# Patient Record
Sex: Female | Born: 1970 | Race: White | Hispanic: No | Marital: Married | Smoking: Former smoker
Health system: Southern US, Community
[De-identification: ages and names within clinical notes are randomized; demographics above are authoritative.]

## PROBLEM LIST (undated history)

## (undated) DIAGNOSIS — F319 Bipolar disorder, unspecified: Secondary | ICD-10-CM

## (undated) DIAGNOSIS — R102 Pelvic and perineal pain: Secondary | ICD-10-CM

## (undated) DIAGNOSIS — F419 Anxiety disorder, unspecified: Secondary | ICD-10-CM

## (undated) DIAGNOSIS — F17200 Nicotine dependence, unspecified, uncomplicated: Secondary | ICD-10-CM

## (undated) HISTORY — DX: Pelvic and perineal pain: R10.2

## (undated) HISTORY — DX: Nicotine dependence, unspecified, uncomplicated: F17.200

## (undated) HISTORY — DX: Bipolar disorder, unspecified: F31.9

## (undated) HISTORY — PX: PELVIC LAPAROSCOPY: SHX162

## (undated) HISTORY — DX: Anxiety disorder, unspecified: F41.9

---

## 1996-01-25 HISTORY — PX: TUBAL LIGATION: SHX77

## 2000-08-30 ENCOUNTER — Emergency Department (HOSPITAL_COMMUNITY): Admission: EM | Admit: 2000-08-30 | Discharge: 2000-08-30 | Payer: Self-pay | Admitting: Emergency Medicine

## 2001-01-24 HISTORY — PX: FOOT ARTHRODESIS, MODIFIED MCBRIDE: SUR52

## 2001-10-05 ENCOUNTER — Other Ambulatory Visit: Admission: RE | Admit: 2001-10-05 | Discharge: 2001-10-05 | Payer: Self-pay | Admitting: Obstetrics and Gynecology

## 2003-01-25 HISTORY — PX: SPINAL FUSION: SHX223

## 2004-11-05 ENCOUNTER — Ambulatory Visit (HOSPITAL_COMMUNITY): Admission: RE | Admit: 2004-11-05 | Discharge: 2004-11-05 | Payer: Self-pay | Admitting: Obstetrics and Gynecology

## 2005-04-21 ENCOUNTER — Inpatient Hospital Stay (HOSPITAL_COMMUNITY): Admission: AD | Admit: 2005-04-21 | Discharge: 2005-04-23 | Payer: Self-pay | Admitting: Specialist

## 2005-04-21 ENCOUNTER — Encounter (INDEPENDENT_AMBULATORY_CARE_PROVIDER_SITE_OTHER): Payer: Self-pay | Admitting: *Deleted

## 2008-01-25 HISTORY — PX: OTHER SURGICAL HISTORY: SHX169

## 2008-09-19 ENCOUNTER — Encounter: Payer: Self-pay | Admitting: Obstetrics and Gynecology

## 2008-09-19 ENCOUNTER — Ambulatory Visit: Payer: Self-pay | Admitting: Obstetrics & Gynecology

## 2008-09-19 ENCOUNTER — Inpatient Hospital Stay (HOSPITAL_COMMUNITY): Admission: RE | Admit: 2008-09-19 | Discharge: 2008-09-20 | Payer: Self-pay | Admitting: Obstetrics and Gynecology

## 2008-12-29 ENCOUNTER — Ambulatory Visit: Payer: Self-pay | Admitting: Women's Health

## 2009-04-30 ENCOUNTER — Ambulatory Visit: Payer: Self-pay | Admitting: Radiology

## 2009-04-30 ENCOUNTER — Emergency Department (HOSPITAL_BASED_OUTPATIENT_CLINIC_OR_DEPARTMENT_OTHER): Admission: EM | Admit: 2009-04-30 | Discharge: 2009-04-30 | Payer: Self-pay | Admitting: Emergency Medicine

## 2009-05-25 ENCOUNTER — Encounter: Admission: RE | Admit: 2009-05-25 | Discharge: 2009-05-25 | Payer: Self-pay | Admitting: Diagnostic Neuroimaging

## 2009-05-25 ENCOUNTER — Ambulatory Visit: Payer: Self-pay | Admitting: Women's Health

## 2009-08-26 ENCOUNTER — Ambulatory Visit: Payer: Self-pay | Admitting: Women's Health

## 2009-11-05 ENCOUNTER — Ambulatory Visit: Payer: Self-pay | Admitting: Women's Health

## 2010-05-01 LAB — CBC
HCT: 32.2 % — ABNORMAL LOW (ref 36.0–46.0)
HCT: 41.2 % (ref 36.0–46.0)
Hemoglobin: 10.9 g/dL — ABNORMAL LOW (ref 12.0–15.0)
Hemoglobin: 13.8 g/dL (ref 12.0–15.0)
MCHC: 33.4 g/dL (ref 30.0–36.0)
MCHC: 33.8 g/dL (ref 30.0–36.0)
MCV: 89.2 fL (ref 78.0–100.0)
MCV: 90.1 fL (ref 78.0–100.0)
Platelets: 258 10*3/uL (ref 150–400)
Platelets: 277 10*3/uL (ref 150–400)
RBC: 3.57 MIL/uL — ABNORMAL LOW (ref 3.87–5.11)
RBC: 4.61 MIL/uL (ref 3.87–5.11)
RDW: 13.6 % (ref 11.5–15.5)
RDW: 14 % (ref 11.5–15.5)
WBC: 20.2 10*3/uL — ABNORMAL HIGH (ref 4.0–10.5)
WBC: 9.2 10*3/uL (ref 4.0–10.5)

## 2010-05-01 LAB — PREGNANCY, URINE: Preg Test, Ur: NEGATIVE

## 2010-06-08 NOTE — Discharge Summary (Signed)
NAMEMIANGEL, FLOM NO.:  [id number]   MEDICAL RECORD NO.:  [id number]          PATIENT TYPE:  INP   LOCATION:  9305                          FACILITY:  [facility]   PHYSICIAN:  Arlyce Harman, MD     DATE OF BIRTH:  1970-09-01   DATE OF ADMISSION:  09/19/2008  DATE OF DISCHARGE:                               DISCHARGE SUMMARY   REASON FOR HOSPITALIZATION:  This patient is a 40 year old gravida 2,  para 2, who is status post C section x2 and tubal ligation who complains  of worsening right lower quadrant pain, an ultrasound revealed a  persistent 4.5 cm cyst on ovary and due to the severity of the pain, the  patient has requested surgical treatment.  On September 19, 2008, she  underwent exploratory laparotomy with extensive lysis of adhesions of  the right tube and ovary, the omentum, and the anterior abdominal wall  above.  The ovaries and the tube and operative laparoscopy.  Postop  course has been uncomplicated, and the patient has been discharged this  morning in great condition.   DISCHARGE MEDICATIONS:  Percocet 5 mg, she is to take 1-2 every 4-6  hours as needed for pain, she has 30 tablets, Chromagen 1 a day.  She is  to take Colace 1-2 a day for the next 7 days.   LABORATORY FINDINGS:  Hemoglobin is 10.4.   SIGNIFICANT FINDINGS:  None.   PROCEDURES PERFORMED AND TREATMENT RENDERED:  As noted above previously  in dictation.   CONDITION OF THE PATIENT ON DISCHARGE:  The patient is in excellent  condition.  Vital signs are stable and the patient is ambulatory.   DISCHARGE INSTRUCTIONS:  Instructions given to the patient are to come  to my office in 2 days to have staples removed.  The patient is to avoid  intercourse for 3 weeks.  She is to call if her temperature goes 101.8  or if she has any unusual pain or heavy bleeding.      Arlyce Harman, MD  Electronically Signed     EG/MEDQ  D:  09/20/2008  T:  09/20/2008  Job:  [id number]

## 2010-06-08 NOTE — Op Note (Signed)
Tammy Huber, Tammy Huber NO.:  [id number]   MEDICAL RECORD NO.:  [id number]         PATIENT TYPE:  WINP   LOCATION:                                FACILITY:  [facility]   PHYSICIAN:  Arlyce Harman, MD     DATE OF BIRTH:  [birth year]   DATE OF PROCEDURE:  DATE OF DISCHARGE:                               OPERATIVE REPORT   PREOPERATIVE DIAGNOSES:  1. Right lower quadrant pain.  2. Right ovarian cyst.   POSTOPERATIVE DIAGNOSIS:  1. Right lower quadrant pain.  2. Right ovarian cyst.  3. Extensive pelvic adhesions of the ovaries, tubes, bowel, anterior      abdominal wall, omentum, and uterus.   NAME OF OPERATION/PROCEDURE:  She underwent exploratory laparotomy with  extensive lysis of adhesions, right salpingo-oophorectomy, and operative  laparoscopy.   SURGEON:  Arlyce Harman, MD   ASSISTANT:  Dr. Les Pou from the faculty practice.   DESCRIPTION OF THE FINDINGS:  The patient is a 40 year old gravida 2,  para 2 who is status post cesarean section and tubal ligation who has  had severe right lower quadrant pain.  Ultrasound confirmed a cyst on  the ovary, which was not resolved with conservative method.  Therefore,  surgery was planned.  Risks and possible complications of the surgery  were explained and informed consent was given for the planned  procedures.   PROCEDURE:  The patient was placed on the table in the supine position.  General anesthesia was induced.  She was then placed in a dorsal  lithotomy position and the abdomen and vagina were sterilely prepped and  draped in the usual fashion.  A Foley catheter was placed in the  bladder.  A single-tooth tenaculum was placed on the cervix and the  uterus was sounded.  A Hulka tenaculum was then placed on the cervix and  we went above.  An infraumbilical incision was made.  The 10-11 trocar  was inserted without difficulty and the laparoscope was inserted.  We  did confirm that we were in the peritoneal cavity.   Therefore, CO2 was  insufflated into the abdomen.  The second puncture was placed in the  midline using a 10-11 to accommodate the Endopouch and upon  visualization of the peritoneal cavity, there were adhesions of the  omentum to the anterior abdominal wall.  The right tube and ovary could  not be visualized.  We could visualize the left tube and ovary.  It was  covered with adhesions.  The uterus was pulled up against the anterior  abdominal wall and the cul-de-sac was clean.  The procedure was  initiated by coagulating the omentum from the anterior abdominal wall  and lysis of adhesions was performed via laparoscopy; however, it was  soon determined that this was a futile procedure because the adhesions  were just too dense and there were too many adhesions.  Therefore, the  laparoscopic instruments were removed and the gas released.  The  infraumbilical incision was closed with a single 4-0 Vicryl suture.  The  patient was placed in more of a supine position and a  Pfannenstiel  incision was made in the lower abdomen and the abdomen was entered with  some difficulty.  These were adhesions of the omentum to the anterior  abdominal wall and adhesions of the uterus to the anterior abdominal  wall.  After much careful dissection and obtaining hemostasis, we were  able to visualize the pelvis.  The bowel had to be dissected away from  the posterior portion of the uterus on the left using Metzenbaum  scissors and sharp dissection.  After release of the bowel adhesions as  well as omental adhesions, we were able to pack the bowel away and  mobilize the right adnexa.  We were able to use blunt and sharp  dissection to mobilize the right adnexa and a curved Heaney was used to  clamp the infundibulopelvic ligament and the adnexal pedicle and this  was double clamped from each side and then the ovarian tube was  transected off and handed off to the nurse.  The pedicles were doubly  tied with 0  Vicryl suture.  Hemostasis was excellent.  We did identify  the ureter, it was well below the pedicles and it did peristalse  normally on that side.  At this point, we identified the left ovary and  tube and it did appear normal with the exception of some filmy  adhesions.  Attention was then turned to the uterus, which was  completely adhered up against the anterior abdominal pain and the  decision was made since the patient only complained of right lower  quadrant pain, that we would not extend her surgery by trying to dissect  the uterus off, which was probably lead to necessitating a hysterectomy,  which we had not consented to.  Therefore, the procedure was terminated  after hemostasis was obtained using 2-0 Vicryl interrupted sutures on  top of the uterus to maintain hemostasis.  At this point, the abdomen  was irrigated and suctioned out.  Hemostasis was good and a stitch was  placed over the top of the uterine fundus and the peritoneum was closed  after the packing had been removed and we had a good sponge count.  The  peritoneum was closed with 2-0 chromic suture in a running fashion.  The  fascia was closed with 0 Vicryl in a running locked fashion.  The subcu  was closed with 2-0 plain in a running fashion.  The staples were placed  on the skin.  At the end of the procedure, sponge and instrument counts  were correct.  Estimated blood loss 300 mL.      Arlyce Harman, MD  Electronically Signed     EG/MEDQ  D:  09/19/2008  T:  09/20/2008  Job:  [id number]

## 2010-06-11 NOTE — Op Note (Signed)
NAMEMIKAELAH, TROSTLE              ACCOUNT NO.:  [id number]   MEDICAL RECORD NO.:  [id number]          PATIENT TYPE:  INP   LOCATION:  3017                         FACILITY:  [facility]   PHYSICIAN:  Kerrin Champagne, M.D.   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   PROCEDURE:  Bilateral microdiskectomy L5-S1 with excision of extruded HNP  left L5-S1, bilateral microdisk excision L5-S1.   SURGEON:  Dr. Vira Browns.   ASSISTANT:  Wende Neighbors, P.A.C.   ANESTHESIA:  GOT.  Dr. Arta Bruce / Dr. Gypsy Balsam.   SPECIMENS:  Large extruded HNP central and left-sided L5-S1.  Calcified HNP  of L5-S1.  Right-sided HNP.   ESTIMATED BLOOD LOSS:  50 cc.   COMPLICATIONS:  None.  The patient returned to the PACU in good condition.   BRIEF CLINICAL HISTORY:  This patient is a 40 year old female with a five-  week history of severe back pain with radiation in both legs, left side  greater than right.  She has undergone conservative management with the use  of epidural steroids and oral steroid medication without relief of pain.  She has significant discomfort and continues to use narcotic medication to  relieve her discomfort.  She has positive straight-leg-raise sign on the  left.  Positive right-sided opposite straight-leg-raise sign.  Weakness in  left foot dorsiflexion and left great toe dorsiflexion.  Diminished left  ankle jerk.  Radiographs with degenerative disk change at L5-S1.  MRI study  with a very large extruded HNP over the posterior aspect of the vertebral  body of L5 associated with HNP at the L5-S1 level that is centrally  oriented.  Extruded fragment extends along the left side of the thecal  sac,  affecting the left L5 and S1 nerve roots.   INTRAOPERATIVE FINDINGS:  As above.   DESCRIPTION OF PROCEDURE:  After adequate general anesthesia with the  patient in the knee/chest position, an Mooar frame was used.  Standard  preoperative antibiotics.  Standard prep with DuraPrep solution.  All  pressure points were well padded.  She was draped in the usual manner.  Two  spinal needles placed and intraoperative lateral radiograph demonstrated the  needles on either side of the spinous process of L5.  The patient had a  standard incision, about 2-1/2 inches in length through the skin and  subcutaneous layers.  She was about three inches deep with subcu fat down to  the lumbodorsal fascia.  This was incised on both sides to the respective  spinous process of L5 and of S1.  A Kocher clamp placed on the spinous  process of expected L5. Intraoperative lateral radiograph demonstrated the  Kocher on the L5 spinous process.  This was marked for continued  identification with a single 2-0 Vicryl stitch.  Cobb was then used to  elevate the paralumbar muscles bilaterally off of the posterior aspects of  the lamina of L5 and the superior aspect of the S1 lamina.  McCullough  retractors were then inserted and retraction obtained exposing both sides of  the posterior aspect of the inner laminar region at L5-S1.  Leksell rongeur  was used to carefully debride any soft tissue attachment to the inferior  aspect of the lamina of L5 and then used to excise a small portion of the  right side inferior aspect of the lamina of L5 and a larger amount on the  left side up to the region of the insertion of the ligamentum flavum on the  left side.   The initial exposure obtained using loupe magnification and headlight.  A 3-  mm Kerrison then used to carefully free up the attachment of the ligamentum  flavum at the L5-S1 level off the undersurface, anterior surface of the  inferior aspect of the lamina of  L5.  This was then lifted with a nerve hook  and resected from the L5 lamina to the S1 lamina, resecting it off of the  superior aspect of the lamina of S1, performing a foraminotomy over the S1  nerve root and removing its attachment to the medial facet at the L5-S1  level on the left side.  A small amount of Gelfoam was then placed along  with a cottonoid.  The same type of laminotomy was made on the right side  using a 3-mm Kerrison and debride the superficial portion of the ligamentum  flavum on the right side.  Then introducing the 3-mm Kerrison over the  superior aspect of the S1 lamina, resecting the ligamentum flavum off of the  superior aspect of the lamina of S1, performing a foraminotomy over the  right S1 nerve root, decompressing the right L5 lateral recess and then  excising the ligamentum flavum on the right side off of the anterior aspect  of the L5 lamina.  This was completed.  Then the operating room microscope  was carefully draped and sterilely brought into the field. On the left side  the pedicle was probed.  The foramina to the S1 nerve root was carefully  probed and ensured patency.  Adequate decompression of the S1 nerve.  S1  nerve root and the lateral aspect of the thecal sac at the L5-S1 level was  then carefully retracted medially.  Epidural veins were cauterized using  bipolar electrocautery.  The posterior aspect of the disk identified with a  calcified shelf over its superior margin.  Continued exploration of the  lateral recess on the left side was continued up to the foramen of the L5  nerve root.  A 3-mm Kerrison used to resect further small portion, more of  the inferior aspect of the lamina of L5, preserving the pars over an 8-mm  area.  Disk material was then encountered superior to the disk space on the  left side in the axillary area of the L5 nerve root.  D'Errico retractor used to retract the thecal sac.  A 15 blade scalpel was then used to incise   the posterior longitudinal ligament on the left side just medial to the  axillary portion of the L5 nerve root.  A nerve hook could then be  introduced to carefully free up disc material and then carefully deliver it  into the laminotomy region.  Pituitary rongeur was then able to grasp the  disk material and in combination with the use  of a nerve hook, several large  pieces of disk material were able to be delivered through the left-sided  laminotomy within the spinal canal, decompressing the extruded fragment.  At  least four large fragments of disk material were resected and this combined  equaled a very large pile of disk material, about 2.5 cm x 2.0 cm x 1.5 cm.   This appeared to be adequate for the extruded fragment seen which was quite  large.  Further attempts at irrigation and resection of more disk material  were uneventful in removing more fragments.  Irrigation was performed into  this area and the use of an up-biting pituitary and attempts at using micro  pituitary were unsuccessful in removing any further disk material here.  The  posterior aspect of the disc was noted to be herniated chronically on the  left side with a calcified shell of bone.  Attempts here at incising the  disk were fairly unsuccessful on the left side.  Attention turned to the  right side where the lateral thecal sac of the L5-S1 was retracted medially,  identified the S1 nerve root, retracting it medially.  Bipolar  electrocautery used to control epidural veins and cauterize them.  Exposing  the posterior disk on the right side, it was noted to be herniated.  This  was protruding.  A 15 blade scalpel was used to make a longitudinal incision  in the disk.  Pituitary was then used to excise herniated disk material from  the right side, decompressing the right side of the spinal canal in the  right posterior disk region.  An Gwyneth Sprout was used to further free up annular  material and then excising it using  pituitary rongeurs, up-biting, down-  biting and straight until no further fragments could be noted.  Attention  then turned to the left side.  Review of the MRI demonstrated that the  posterior protrusion on this side appeared to make up a fair amount of mass  effect continuing on the thecal sac.  Pressure on the posterior aspect of  the disk demonstrated that the calcified portion of the disk appeared to  still be mobile and this potentially could represent a shallow bone that had  fractured with the disk protrusion.  Pituitary rongeur was used to enter the  disk space.  A 3-mm Kerrison used to debride the calcified portion of the  disk posteriorly, resecting this and decompressing the left side of the  thecal sac and the spinal canal at the level of the disk.  Further disk was  resected from the disk space on the left side until no further fragments could be obtained.  Irrigation was performed.  Careful inspection of the  spinal canal demonstrated the L5 nerve root to be exiting freely without any  nerve compression, S1 as well on the left side.  The posterior thecal sac  and ventral aspect of thecal sac with no further compression.  Gelfoam,  thrombin soaked, and cottonoid was placed in the lateral recess on the left  side.  Evaluation of the right side thecal sac and L5-S1 disk again  demonstrates some small fragments that likely were pushed across the midline  with the resection of disk on the left side.  These were excised using  pituitary rongeur.  Irrigation was performed.  A portion of Gelfoam placed.  When hemostasis was obtained, the Gelfoam and cottonoids were removed in  total.  There was no active bleeding present.  Hockey-stick nerve probe  was  easily passed out bilateral S1 and L5 neural foramina, demonstrating their  patency and no further nerve compression present.  The soft tissues were  allowed to fall back into place.  There was no active bleeding present.  The   lumbodorsal fascia was then approximated in the midline with interrupted #1  Vicryl sutures.  Deep subcutaneous layer was approximated with interrupted 0  Vicryl suture, more superficial layers with interrupted 0 and 2-0 Vicryl  sutures.  Skin closed with a running subcu stitch of 4-0 Vicryl.  Skin  closed with Dermabond.  4 x 4's affixed to the skin with Hypafix tape.  The  patient was then returned to a supine position, reactivated, extubated and  returned to the recovery room in satisfactory condition.  All instrument and  sponge counts were correct at the end of the case.      Kerrin Champagne, M.D.  Electronically Signed     JEN/MEDQ  D:  04/21/2005  T:  04/23/2005  Job:  [id number]

## 2010-07-14 ENCOUNTER — Ambulatory Visit (INDEPENDENT_AMBULATORY_CARE_PROVIDER_SITE_OTHER): Payer: BC Managed Care – PPO | Admitting: Women's Health

## 2010-07-14 DIAGNOSIS — B373 Candidiasis of vulva and vagina: Secondary | ICD-10-CM

## 2010-07-14 DIAGNOSIS — N760 Acute vaginitis: Secondary | ICD-10-CM

## 2010-07-14 DIAGNOSIS — N926 Irregular menstruation, unspecified: Secondary | ICD-10-CM

## 2010-10-15 ENCOUNTER — Encounter (HOSPITAL_BASED_OUTPATIENT_CLINIC_OR_DEPARTMENT_OTHER): Payer: Self-pay | Admitting: *Deleted

## 2010-10-15 ENCOUNTER — Emergency Department (HOSPITAL_BASED_OUTPATIENT_CLINIC_OR_DEPARTMENT_OTHER)
Admission: EM | Admit: 2010-10-15 | Discharge: 2010-10-15 | Discharge status: Against Medical Advice | Payer: BC Managed Care – PPO | Attending: Emergency Medicine | Admitting: Emergency Medicine

## 2010-10-15 DIAGNOSIS — R109 Unspecified abdominal pain: Secondary | ICD-10-CM | POA: Insufficient documentation

## 2010-10-15 LAB — COMPREHENSIVE METABOLIC PANEL
ALT: 10 U/L (ref 0–35)
AST: 15 U/L (ref 0–37)
Albumin: 4.3 g/dL (ref 3.5–5.2)
Alkaline Phosphatase: 73 U/L (ref 39–117)
BUN: 11 mg/dL (ref 6–23)
CO2: 25 mEq/L (ref 19–32)
Calcium: 9.6 mg/dL (ref 8.4–10.5)
Chloride: 101 mEq/L (ref 96–112)
Creatinine, Ser: 0.6 mg/dL (ref 0.50–1.10)
GFR calc Af Amer: >60 mL/min (ref >60)
GFR calc non Af Amer: >60 mL/min (ref >60)
Glucose, Bld: 96 mg/dL (ref 70–99)
Potassium: 3.7 mEq/L (ref 3.5–5.1)
Sodium: 137 mEq/L (ref 135–145)
Total Bilirubin: 0.3 mg/dL (ref 0.3–1.2)
Total Protein: 7.6 g/dL (ref 6.0–8.3)

## 2010-10-15 LAB — DIFFERENTIAL
Basophils Absolute: 0 10*3/uL (ref 0.0–0.1)
Basophils Relative: 0 % (ref 0–1)
Eosinophils Absolute: 0.1 10*3/uL (ref 0.0–0.7)
Eosinophils Relative: 1 % (ref 0–5)
Lymphocytes Relative: 14 % (ref 12–46)
Lymphs Abs: 2.6 10*3/uL (ref 0.7–4.0)
Monocytes Absolute: 0.7 10*3/uL (ref 0.1–1.0)
Monocytes Relative: 4 % (ref 3–12)
Neutro Abs: 15.4 10*3/uL — ABNORMAL HIGH (ref 1.7–7.7)
Neutrophils Relative %: 81 % — ABNORMAL HIGH (ref 43–77)

## 2010-10-15 LAB — URINALYSIS, ROUTINE W REFLEX MICROSCOPIC
Bilirubin Urine: NEGATIVE
Glucose, UA: NEGATIVE mg/dL
Ketones, ur: 15 mg/dL — AB
Leukocytes, UA: NEGATIVE
Nitrite: NEGATIVE
Protein, ur: NEGATIVE mg/dL
Specific Gravity, Urine: 1.03 (ref 1.005–1.030)
Urobilinogen, UA: 1 mg/dL (ref 0.0–1.0)
pH: 5.5 (ref 5.0–8.0)

## 2010-10-15 LAB — PREGNANCY, URINE: Preg Test, Ur: NEGATIVE

## 2010-10-15 LAB — URINE MICROSCOPIC-ADD ON

## 2010-10-15 LAB — CBC
HCT: 42.7 % (ref 36.0–46.0)
Hemoglobin: 14.4 g/dL (ref 12.0–15.0)
MCH: 28.6 pg (ref 26.0–34.0)
MCHC: 33.7 g/dL (ref 30.0–36.0)
MCV: 84.7 fL (ref 78.0–100.0)
Platelets: 274 10*3/uL (ref 150–400)
RBC: 5.04 MIL/uL (ref 3.87–5.11)
RDW: 14.6 % (ref 11.5–15.5)
WBC: 19 10*3/uL — ABNORMAL HIGH (ref 4.0–10.5)

## 2010-10-15 NOTE — ED Notes (Signed)
Pt c/o painful urination x 2 days 

## 2010-10-18 ENCOUNTER — Encounter: Payer: Self-pay | Admitting: Women's Health

## 2010-10-18 ENCOUNTER — Ambulatory Visit (INDEPENDENT_AMBULATORY_CARE_PROVIDER_SITE_OTHER): Payer: BC Managed Care – PPO | Admitting: Women's Health

## 2010-10-18 DIAGNOSIS — R102 Pelvic and perineal pain: Secondary | ICD-10-CM

## 2010-10-18 DIAGNOSIS — N949 Unspecified condition associated with female genital organs and menstrual cycle: Secondary | ICD-10-CM

## 2010-10-18 NOTE — Progress Notes (Signed)
Subjective:     Patient ID: Tammy Huber, female   DOB: 1970/09/16, 40 y.o.   MRN: [id number]  HPI had severe low,sharp abdominal pain rated a 10/pain scale on September 21. Evaluated at [facility] in [facility].  Negative CT scan with negative appendix, negative for gallstones, unremarkable liver and spleen. CMP normal, H/H 13.4/40.1 WBC 20.  Pelvic ultrasound normal uterus, hx of rso, left ovary 4.5x3.2 cm with 2.5 simple cyst and 3.7 complex likely representing hemorrhagic cyst. Review of CT scan report, lab reports, ultrasound report reviewed with patient.   Rates pain at 7-8 today. Requests a hysterectomy due to long, 20 year pelvic pain and cyst history.  Currently on Percocet for pain. History of a BTL, and history of adhesions with RSO in 2010. Denies urinary symptoms, discharge or fever. States pain is debilitating and interferes with work and life.    Review of Systems     Objective:   Physical Exam     Assessment:     Pelvic pain and ovarian cysts.    Plan:     Will schedule consult with Dr. Audie Box to discuss possible hysterectomy. ACOG handout was given, reviewed. Did review risks of surgery and will decide a plan  at office visit with Dr. Audie Box.

## 2010-10-19 ENCOUNTER — Ambulatory Visit (INDEPENDENT_AMBULATORY_CARE_PROVIDER_SITE_OTHER): Payer: BC Managed Care – PPO | Admitting: Gynecology

## 2010-10-19 ENCOUNTER — Encounter: Payer: Self-pay | Admitting: Gynecology

## 2010-10-19 VITALS — BP 120/80

## 2010-10-19 DIAGNOSIS — N760 Acute vaginitis: Secondary | ICD-10-CM

## 2010-10-19 DIAGNOSIS — N83209 Unspecified ovarian cyst, unspecified side: Secondary | ICD-10-CM

## 2010-10-19 DIAGNOSIS — A499 Bacterial infection, unspecified: Secondary | ICD-10-CM

## 2010-10-19 DIAGNOSIS — B373 Candidiasis of vulva and vagina: Secondary | ICD-10-CM

## 2010-10-19 DIAGNOSIS — R102 Pelvic and perineal pain: Secondary | ICD-10-CM

## 2010-10-19 DIAGNOSIS — N898 Other specified noninflammatory disorders of vagina: Secondary | ICD-10-CM

## 2010-10-19 DIAGNOSIS — N949 Unspecified condition associated with female genital organs and menstrual cycle: Secondary | ICD-10-CM

## 2010-10-19 DIAGNOSIS — Z113 Encounter for screening for infections with a predominantly sexual mode of transmission: Secondary | ICD-10-CM

## 2010-10-19 MED ORDER — METRONIDAZOLE 500 MG PO TABS: 500.0000 mg | ORAL_TABLET | Freq: Two times a day (BID) | ORAL | Status: AC

## 2010-10-19 MED ORDER — FLUCONAZOLE 150 MG PO TABS: 150.0000 mg | ORAL_TABLET | Freq: Once | ORAL | Status: AC

## 2010-10-19 NOTE — Patient Instructions (Signed)
followup with decision as far surgery

## 2010-10-19 NOTE — Progress Notes (Signed)
Patient presents for consultation concerning hysterectomy. She is a long complex history to include 20 years of pelvic pain primarily deep dyspareunia. She notes pain with intercourse every episode and now is limiting her activities. She has a past history of cesarean section x2 with BTL. She underwent initial laparoscopy by Dr. Arlyce Harman in 2000 for pelvic pain right ovarian cyst and due to extensive pelvic adhesions and adherence of the uterus and the anterior abdominal wall she had to convert to an exploratory laparotomy and RSO. She described again the uterus adherent to the anterior abdominal wall she was able to visualize the left adnexa although there were some filmy adhesions noted. She did not free up the uterus for fear that would lead to a hysterectomy which was not the plan at that time. The patient notes that she is continuing to have pain on and off particularly consistent deep dyspareunia. She most recently was evaluated this past weekend in [place] with acute exacerbation of pain on the left she had a CT scan which showed a generous left ovary and a followup ultrasound which showed a 3-4 cm probable hemorrhagic cyst. She also notes that her periods have been irregular over the last several years she saw [place] in the spring had TSH prolactin which were normal and her most recent ultrasound this past week showed an endometrial echo of 8 mm considered to be normal. Harriett Sine did give her a NuvaRing to try hormonal regulation of her periods which were coming every 2 weeks to every 4 weeks she said she transiently tried this but then stopped it. Her biggest issue is the deep dyspareunia that is interfering with her relationship.  Exam Spine: Straight no CVA tenderness Abdomen: Mild lower, pain no rebound guarding masses organomegaly Pelvic: External BUS vagina with white discharge KOH wet prep done, cervix normal GC Chlamydia screen done, uterus anteverted normal size midline and mobile mild  tenderness to manipulation, right left adnexa without gross masses or significant tenderness, rectovaginal exam is normal  Assessment and plan: Chronic pelvic pain primarily dyspareunia history of RSO with significant pelvic adhesions particularly uterus to an anterior abdominal wall. Most recently with acute exacerbation of pain with evaluation was consistent with a hemorrhagic cyst. 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 As far as her most recent exacerbation of pain, I think it is due to a probable functional hemorrhagic cyst. I discussed possible conservative options to include hormonal manipulation to regulate her menses. She does smoke I don't think estrogen-containing such as pills patch or ring is wise that alternatives such as menstrual suppression Depo-Lupron trial of progesterone only possibilities up to and including IUD were reviewed. As far as the most recent exacerbation of her pain due to probable functional cyst, we could treat this with pain medicine with reultrasound in 2 months to make sure it resolves.  Patient does not describe a lot of pain in between but only the deep dyspareunia. As far as the deep dyspareunia is concerned I really think is probably more structural and at that it will not be correct unless we proceed with hysterectomy and possible LSO. I discussed the whole issue of her adhesive disease how this would significantly increase risk of surgery to include possible attempted laparoscopic probable exploratory laparotomy to complete an increased risk of injury to bowel and bladder ureters vessels and nerves due to the adhesions and required dissection.  The general risks of surgery to include infection incisional complications transfusion were also reviewed along with the risk of damage to internal organs such as bowel bladder ureters vessels and nerves. Options of keeping  her left ovary if normal appearance recognizing the difficulty of reoperation if pain persists versus removing her ovary and initiating ERT were reviewed. The WHI study and the risks of ERT were discussed to include stroke heart attack DVT as well as possible increased risk of breast cancer. I asked the patient and her husband think of all these options and issues and and a followup with me with their decision.  She does have white discharge KOH wet prep was positive for yeast and clue cells unrecoverable with Diflucan 150x1 dose Flagyl 500 twice a day x7 days alcohol avoidance discussed. She'll followup on her GC and Chlamydia screen.

## 2010-10-28 ENCOUNTER — Encounter: Payer: Self-pay | Admitting: Gynecology

## 2010-11-26 ENCOUNTER — Encounter: Payer: Self-pay | Admitting: Women's Health

## 2010-11-26 ENCOUNTER — Ambulatory Visit (INDEPENDENT_AMBULATORY_CARE_PROVIDER_SITE_OTHER): Payer: BC Managed Care – PPO | Admitting: Women's Health

## 2010-11-26 DIAGNOSIS — B373 Candidiasis of vulva and vagina: Secondary | ICD-10-CM

## 2010-11-26 DIAGNOSIS — L293 Anogenital pruritus, unspecified: Secondary | ICD-10-CM

## 2010-11-26 DIAGNOSIS — N898 Other specified noninflammatory disorders of vagina: Secondary | ICD-10-CM

## 2010-11-26 MED ORDER — TERCONAZOLE 0.8 % VA CREA: 1.0000 | TOPICAL_CREAM | Freq: Every day | VAGINAL | Status: AC

## 2010-11-26 NOTE — Progress Notes (Signed)
  Presents with a complaint of vaginal discharge with some itching, not sure if yeast or  BV. Denies any UTI symptoms or fever. Monthly cycle/BTL.  Exam: External genitalia is slightly erythematous at introitus, speculum exam scant white discharge, wet prep positive for yeast.   Plan: Terazol 3 one applicator at bedtime x3, yeast prevention discussed call or return if no relief.

## 2010-12-20 ENCOUNTER — Ambulatory Visit (INDEPENDENT_AMBULATORY_CARE_PROVIDER_SITE_OTHER): Payer: BC Managed Care – PPO | Admitting: Women's Health

## 2010-12-20 ENCOUNTER — Encounter: Payer: Self-pay | Admitting: Women's Health

## 2010-12-20 VITALS — BP 110/70

## 2010-12-20 DIAGNOSIS — B3731 Acute candidiasis of vulva and vagina: Secondary | ICD-10-CM

## 2010-12-20 DIAGNOSIS — B373 Candidiasis of vulva and vagina: Secondary | ICD-10-CM

## 2010-12-20 DIAGNOSIS — N898 Other specified noninflammatory disorders of vagina: Secondary | ICD-10-CM

## 2010-12-20 DIAGNOSIS — N949 Unspecified condition associated with female genital organs and menstrual cycle: Secondary | ICD-10-CM

## 2010-12-20 MED ORDER — FLUCONAZOLE 100 MG PO TABS: 100.0000 mg | ORAL_TABLET | Freq: Every day | ORAL | Status: AC

## 2010-12-20 NOTE — Progress Notes (Signed)
THE DIFLUCAN RX DID NOT E-SCRIBE SO I CALLED RX TO [facility] PHARMACY

## 2010-12-20 NOTE — Progress Notes (Signed)
Patient ID: Tammy Huber, female   DOB: 06/05/1970, 40 y.o.   MRN: [id number] Presents with a complaint of vaginal itching and burning for  one week. States had her cycle last week with symptoms of mostly vaginal burning, used a pad instead of tampons with no relief. States occasionally will have odor, but denies any at this time. States feels like she gets yeast symptoms with her cycle each month. Contraceptives with BTL. Treated for yeast less than one month ago.  Exam: External genitalia is slightly erythematous at introitus, speculum exam cervix is pink, healthy scant white discharge vaginal walls are erythematous. Bimanual no CMT or adnexal fullness or tenderness. Wet prep positive for yeast  Plan: Diflucan 100 by mouth daily for 5 days, then weekly for several weeks, and then monthly with cycle to help prevent recurrence. Is aware of yeast prevention, will call if no relief.

## 2010-12-20 NOTE — Patient Instructions (Signed)
Take diflucan daily for 5 days and then weekly for 3-4 weeks and then monthly with cycle.

## 2011-01-13 DIAGNOSIS — Z0000 Encounter for general adult medical examination without abnormal findings: Secondary | ICD-10-CM | POA: Insufficient documentation

## 2011-04-05 ENCOUNTER — Telehealth: Payer: Self-pay | Admitting: *Deleted

## 2011-04-05 NOTE — Telephone Encounter (Signed)
(  pt is aware you are out of office)Pt called c/o yeast infection symptoms  x 1 week. I offered OV to pt and she declined stating she has meetings at work that she must attend. Pt is requesting diflucan. Pt said that you gave her diflucan to take before and after period(see 12/20/10 office note) please advise

## 2011-04-06 MED ORDER — FLUCONAZOLE 100 MG PO TABS: ORAL_TABLET | ORAL | Status: DC

## 2011-04-06 NOTE — Telephone Encounter (Signed)
Patient informed rx sent.

## 2011-04-06 NOTE — Telephone Encounter (Signed)
Please call in Diflucan 100 by mouth daily for 3 days then monthly when necessary #15 no refills. Office visit if no relief. Tammy Huber patient has had problems with recurrent yeast.

## 2011-05-20 ENCOUNTER — Ambulatory Visit (INDEPENDENT_AMBULATORY_CARE_PROVIDER_SITE_OTHER): Payer: BC Managed Care – PPO | Admitting: Women's Health

## 2011-05-20 ENCOUNTER — Encounter: Payer: Self-pay | Admitting: Women's Health

## 2011-05-20 VITALS — Wt 203.0 lb

## 2011-05-20 DIAGNOSIS — B9689 Other specified bacterial agents as the cause of diseases classified elsewhere: Secondary | ICD-10-CM

## 2011-05-20 DIAGNOSIS — R102 Pelvic and perineal pain: Secondary | ICD-10-CM

## 2011-05-20 DIAGNOSIS — N760 Acute vaginitis: Secondary | ICD-10-CM

## 2011-05-20 DIAGNOSIS — A499 Bacterial infection, unspecified: Secondary | ICD-10-CM

## 2011-05-20 DIAGNOSIS — N949 Unspecified condition associated with female genital organs and menstrual cycle: Secondary | ICD-10-CM

## 2011-05-20 LAB — URINALYSIS W MICROSCOPIC + REFLEX CULTURE
Bilirubin Urine: NEGATIVE
Casts: NONE SEEN
Crystals: NONE SEEN
Glucose, UA: NEGATIVE mg/dL
Ketones, ur: NEGATIVE mg/dL
Leukocytes, UA: NEGATIVE
Nitrite: NEGATIVE
Protein, ur: NEGATIVE mg/dL
Specific Gravity, Urine: 1.025 (ref 1.005–1.030)
Urobilinogen, UA: 0.2 mg/dL (ref 0.0–1.0)
WBC, UA: NONE SEEN WBC/hpf (ref <3)
pH: 5.5 (ref 5.0–8.0)

## 2011-05-20 LAB — WET PREP FOR TRICH, YEAST, CLUE: Yeast Wet Prep HPF POC: NONE SEEN

## 2011-05-20 MED ORDER — METRONIDAZOLE 500 MG PO TABS: 500.0000 mg | ORAL_TABLET | Freq: Two times a day (BID) | ORAL | Status: AC

## 2011-05-20 NOTE — Progress Notes (Signed)
Patient ID: Tammy Huber, female   DOB: Nov 06, 1970, 41 y.o.   MRN: [id number] Presents with the complaint of vaginal discharge that is causing a burning sensation. Was treated for bronchitis with a Z-Pak last week, bronchitis is better. Denies any urinary symptoms other than frequency which she always has. Denies a fever. Has had problems with recurrent BV and yeast.  Exam: External genitalia within normal limits, speculum exam cervix is pink without lesion, scant white discharge, minimal erythema. Wet prep positive for amines and moderate amount of clue cells. Bimanual no CMT or adnexa a fullness or tenderness. Discomfort mostly in the vaginal area.  BV  Plan: Flagyl 500 by mouth twice a day for 7 days, alcohol precautions reviewed. After symptoms resolve will try boric acid gelcaps  twice weekly. Overdue for an annual exam, will schedule.

## 2011-05-25 ENCOUNTER — Telehealth: Payer: Self-pay | Admitting: *Deleted

## 2011-05-25 MED ORDER — METRONIDAZOLE 0.75 % VA GEL: 1.0000 | Freq: Every day | VAGINAL | Status: AC

## 2011-05-25 NOTE — Telephone Encounter (Signed)
Ok, please escribe MetroGel vaginal cream 1 applicator at bedtime x5 with no refill.

## 2011-05-25 NOTE — Telephone Encounter (Signed)
Pt informed with the below note, rx sent. 

## 2011-05-25 NOTE — Telephone Encounter (Signed)
Pt was given flagyl 500 mg  rx on 05/20/11, c/o upset stomach while on this medication, pt is requesting gel that was given in the pass. Please advise

## 2011-06-22 ENCOUNTER — Telehealth: Payer: Self-pay | Admitting: *Deleted

## 2011-06-22 MED ORDER — FLUCONAZOLE 150 MG PO TABS: 150.0000 mg | ORAL_TABLET | Freq: Once | ORAL | Status: AC

## 2011-06-22 NOTE — Telephone Encounter (Signed)
Pt informed with the below note. 

## 2011-06-22 NOTE — Telephone Encounter (Signed)
Diflucan 150 mg x1. Office visit if symptoms persist.

## 2011-06-22 NOTE — Telephone Encounter (Signed)
(  pt aware nancy out of office) Pt calling c/o yeast infection due antibody pt had foot surgery, itching,white discharge, vaginal burning. Pt is requesting diflucan to help with yeast. Please advise

## 2011-06-23 ENCOUNTER — Telehealth: Payer: Self-pay | Admitting: *Deleted

## 2011-06-23 MED ORDER — TERCONAZOLE 0.8 % VA CREA: 1.0000 | TOPICAL_CREAM | Freq: Every day | VAGINAL | Status: AC

## 2011-06-23 NOTE — Telephone Encounter (Signed)
Terazol 3 day cream will need office visit if her symptoms persist.

## 2011-06-23 NOTE — Telephone Encounter (Signed)
Pt informed with the below note. 

## 2011-06-23 NOTE — Telephone Encounter (Signed)
Follow up from yesterday telephone encounter pt said diflucan x1 dose has done nonething regarding yeast. Still itching very bad, she would like to have some cream. She is can't make OV due to foot surgery. Please advise

## 2011-09-14 ENCOUNTER — Ambulatory Visit (INDEPENDENT_AMBULATORY_CARE_PROVIDER_SITE_OTHER): Payer: BC Managed Care – PPO | Admitting: Women's Health

## 2011-09-14 ENCOUNTER — Encounter: Payer: Self-pay | Admitting: Women's Health

## 2011-09-14 DIAGNOSIS — F419 Anxiety disorder, unspecified: Secondary | ICD-10-CM | POA: Insufficient documentation

## 2011-09-14 DIAGNOSIS — F329 Major depressive disorder, single episode, unspecified: Secondary | ICD-10-CM

## 2011-09-14 DIAGNOSIS — K219 Gastro-esophageal reflux disease without esophagitis: Secondary | ICD-10-CM

## 2011-09-14 DIAGNOSIS — F32A Depression, unspecified: Secondary | ICD-10-CM

## 2011-09-14 DIAGNOSIS — F17200 Nicotine dependence, unspecified, uncomplicated: Secondary | ICD-10-CM

## 2011-09-14 DIAGNOSIS — IMO0001 Reserved for inherently not codable concepts without codable children: Secondary | ICD-10-CM

## 2011-09-14 DIAGNOSIS — N898 Other specified noninflammatory disorders of vagina: Secondary | ICD-10-CM

## 2011-09-14 DIAGNOSIS — N949 Unspecified condition associated with female genital organs and menstrual cycle: Secondary | ICD-10-CM

## 2011-09-14 DIAGNOSIS — G8929 Other chronic pain: Secondary | ICD-10-CM

## 2011-09-14 DIAGNOSIS — F341 Dysthymic disorder: Secondary | ICD-10-CM

## 2011-09-14 LAB — WET PREP FOR TRICH, YEAST, CLUE
Clue Cells Wet Prep HPF POC: NONE SEEN
Trich, Wet Prep: NONE SEEN
WBC, Wet Prep HPF POC: NONE SEEN
Yeast Wet Prep HPF POC: NONE SEEN

## 2011-09-14 MED ORDER — NYSTATIN-TRIAMCINOLONE 100000-0.1 UNIT/GM-% EX OINT: TOPICAL_OINTMENT | Freq: Two times a day (BID) | CUTANEOUS | Status: DC

## 2011-09-14 MED ORDER — METRONIDAZOLE 0.75 % VA GEL: VAGINAL | Status: AC

## 2011-09-14 MED ORDER — FLUCONAZOLE 100 MG PO TABS: ORAL_TABLET | ORAL | Status: DC

## 2011-09-14 NOTE — Progress Notes (Signed)
Patient ID: [name], female   DOB: 08-01-1970, 41 y.o.   MRN: [id number] Presents with complaint of vaginal burning with rectal burning with bowel movements with small amount of blood on toilet tissue. Also intermittent left lower quadrant pain with occasional loose stools, no constipation. Denies fever or urinary symptoms. History of chronic pelvic pain of unknown origin. History of recurrent BV using  MetroGel vaginal cream 1 applicator after menses with fair relief. States has had increased stress due to to work related employee issues.  Exam: No CVAT, abdomen soft, obese nontender no rebound or radiation of pain. External genitalia erythematous at introitus , rectum erythematous no visible hemorrhoids, fissures or blood. Speculum exam cervix is pink, minimal erythema, wet prep negative. Bimanual no CMT or adnexal tenderness or pain. Rectal exam negative no palpable hemorrhoids, no blood noted.   Vaginal and rectal burning Questionable rectal bleeding  Plan: Home Hemoccult card given with instructions. Mycolog cream to external genitalia and rectum twice daily for several days. Instructed to call if burning sensation continues. Continue with MetroGel vaginal cream 1 applicator after menstrual cycle.

## 2011-11-30 ENCOUNTER — Encounter: Payer: Self-pay | Admitting: Women's Health

## 2011-11-30 ENCOUNTER — Ambulatory Visit (INDEPENDENT_AMBULATORY_CARE_PROVIDER_SITE_OTHER): Payer: BC Managed Care – PPO | Admitting: Women's Health

## 2011-11-30 DIAGNOSIS — N898 Other specified noninflammatory disorders of vagina: Secondary | ICD-10-CM

## 2011-11-30 LAB — WET PREP FOR TRICH, YEAST, CLUE
Clue Cells Wet Prep HPF POC: NONE SEEN
Trich, Wet Prep: NONE SEEN
Yeast Wet Prep HPF POC: NONE SEEN

## 2011-11-30 NOTE — Progress Notes (Signed)
Patient ID: Tammy Huber, female   DOB: [birth year], 41 y.o.   MRN: [id number] Presents with complaint of slight vaginal discharge with odor and rectal pain/burning with bowel movements. Denies urinary symptoms, fever or itching. History of chronic pelvic pain/adhesions, TAH and recurrent BV and yeast.  Exam: No CVAT, external genitalia within normal limits, speculum exam vaginal walls pink minimal discharge wet prep negative. Several small superficial fissures near rectal area.  Rectal irritation  Plan: Stool softener, increase fiber rich foods in diet,. A&D Ointment to rectal area twice daily. Instructed to call if area does not heal.

## 2011-12-14 ENCOUNTER — Encounter: Payer: BC Managed Care – PPO | Admitting: Women's Health

## 2012-02-03 ENCOUNTER — Other Ambulatory Visit: Payer: Self-pay

## 2012-02-03 ENCOUNTER — Other Ambulatory Visit: Payer: Self-pay | Admitting: Women's Health

## 2012-02-03 DIAGNOSIS — N898 Other specified noninflammatory disorders of vagina: Secondary | ICD-10-CM

## 2012-02-03 MED ORDER — FLUCONAZOLE 100 MG PO TABS: ORAL_TABLET | ORAL | Status: DC

## 2012-02-15 ENCOUNTER — Encounter: Payer: BC Managed Care – PPO | Admitting: Women's Health

## 2012-02-22 ENCOUNTER — Encounter: Payer: Self-pay | Admitting: Women's Health

## 2012-02-22 ENCOUNTER — Other Ambulatory Visit (HOSPITAL_COMMUNITY)
Admission: RE | Admit: 2012-02-22 | Discharge: 2012-02-22 | Disposition: A | Discharge status: Home / Self Care | Payer: BC Managed Care – PPO | Source: Ambulatory Visit | Attending: Women's Health | Admitting: Women's Health

## 2012-02-22 ENCOUNTER — Ambulatory Visit (INDEPENDENT_AMBULATORY_CARE_PROVIDER_SITE_OTHER): Payer: BC Managed Care – PPO | Admitting: Women's Health

## 2012-02-22 ENCOUNTER — Encounter: Payer: BC Managed Care – PPO | Admitting: Women's Health

## 2012-02-22 VITALS — BP 124/68 | Ht 63.0 in | Wt 206.0 lb

## 2012-02-22 DIAGNOSIS — N760 Acute vaginitis: Secondary | ICD-10-CM

## 2012-02-22 DIAGNOSIS — Z01419 Encounter for gynecological examination (general) (routine) without abnormal findings: Secondary | ICD-10-CM | POA: Insufficient documentation

## 2012-02-22 DIAGNOSIS — Z1151 Encounter for screening for human papillomavirus (HPV): Secondary | ICD-10-CM | POA: Insufficient documentation

## 2012-02-22 DIAGNOSIS — N898 Other specified noninflammatory disorders of vagina: Secondary | ICD-10-CM

## 2012-02-22 DIAGNOSIS — Z23 Encounter for immunization: Secondary | ICD-10-CM

## 2012-02-22 DIAGNOSIS — B9689 Other specified bacterial agents as the cause of diseases classified elsewhere: Secondary | ICD-10-CM

## 2012-02-22 DIAGNOSIS — A499 Bacterial infection, unspecified: Secondary | ICD-10-CM

## 2012-02-22 DIAGNOSIS — L293 Anogenital pruritus, unspecified: Secondary | ICD-10-CM

## 2012-02-22 LAB — URINALYSIS W MICROSCOPIC + REFLEX CULTURE
Bilirubin Urine: NEGATIVE
Casts: NONE SEEN
Glucose, UA: NEGATIVE mg/dL
Leukocytes, UA: NEGATIVE
WBC, UA: NONE SEEN WBC/hpf (ref <3)
pH: 6.5 (ref 5.0–8.0)

## 2012-02-22 LAB — WET PREP FOR TRICH, YEAST, CLUE: Yeast Wet Prep HPF POC: NONE SEEN

## 2012-02-22 MED ORDER — FLUCONAZOLE 100 MG PO TABS: ORAL_TABLET | ORAL | Status: DC

## 2012-02-22 MED ORDER — METRONIDAZOLE 0.75 % VA GEL: VAGINAL | Status: DC

## 2012-02-22 NOTE — Progress Notes (Signed)
Tammy Huber 1970/10/29 [id number]    History:    The patient presents for annual exam.  Monthly 2-4 days cycle/BTL. History of chronic pelvic pain, had a laparoscopic surgery that noted numerous adhesions in 2010. Long-term history of anxiety and depression does see a counselor and psychiatrist for medication. Is a smoker and is aware of hazards of smoking. Has had problems with recurrent BV and yeast. History of normal Paps. Has not had a mammogram. Labs at primary care, reports as normal.   Past medical history, past surgical history, family history and social history were all reviewed and documented in the EPIC chart. 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   ROS:  A  ROS was performed and pertinent positives and negatives are included in the history.  Exam:  Filed Vitals:   02/22/12 1450  BP: 124/68    General appearance:  Normal Head/Neck:  Normal, without cervical or supraclavicular adenopathy. Thyroid:  Symmetrical, normal in size, without palpable masses or nodularity. Respiratory  Effort:  Normal  Auscultation:  Clear without wheezing or rhonchi Cardiovascular  Auscultation:  Regular rate, without rubs, murmurs or gallops  Edema/varicosities:  Not grossly evident Abdominal  Soft,nontender, without masses, guarding or rebound.  Liver/spleen:  No organomegaly noted  Hernia:  None appreciated  Skin  Inspection:  Grossly normal  Palpation:  Grossly normal Neurologic/psychiatric  Orientation:  Normal with appropriate conversation.  Mood/affect:  Normal  Genitourinary    Breasts: Examined lying and sitting.     Right: Without masses, retractions, discharge or axillary adenopathy.     Left: Without masses, retractions, discharge or axillary adenopathy.   Inguinal/mons:  Normal without inguinal adenopathy  External genitalia:  Normal  BUS/Urethra/Skene's glands:  Normal  Bladder:  Normal  Vagina:  Normal  Cervix:  Normal  Uterus:   normal in size, shape and  contour.  Midline and mobile  Adnexa/parametria:     Rt: Without masses or tenderness.   Lt: Without masses or tenderness.  Anus and perineum: Normal  Digital rectal exam: Normal sphincter tone without palpated masses or tenderness  Assessment/Plan:  42 y.o. M. WF G2 P2 for annual exam with complaint of vaginal burning with slight itching.  Chronic pelvic pain/adhesions Bacteria vaginosis Anxiety/depression-stable meds per psychiatrist Smoker Obesity  Plan: MetroGel vaginal cream 1 applicator at bedtime x5, prescription, proper use, alcohol precautions reviewed. SBE's, instructed to schedule mammogram and to have annually. Increase exercise and decrease calories for weight loss. UA, Pap. Normal Pap 2010, new screening guidelines reviewed.Tdap vaccine given, has a grandchild. Vaginal lubricants as needed with intercourse. Diflucan 100 week prior to menstrual cycle, has had good relief of symptoms, prescription given. Aware of hazards of smoking will try to decrease/quit.   Harrington Challenger [facility], 3:41 PM 02/22/2012

## 2012-02-22 NOTE — Patient Instructions (Addendum)

## 2012-03-10 ENCOUNTER — Other Ambulatory Visit: Payer: Self-pay

## 2012-06-05 ENCOUNTER — Other Ambulatory Visit: Payer: Self-pay | Admitting: *Deleted

## 2012-06-05 DIAGNOSIS — N898 Other specified noninflammatory disorders of vagina: Secondary | ICD-10-CM

## 2012-06-05 MED ORDER — NYSTATIN-TRIAMCINOLONE 100000-0.1 UNIT/GM-% EX OINT: TOPICAL_OINTMENT | Freq: Two times a day (BID) | CUTANEOUS | Status: DC

## 2012-10-25 ENCOUNTER — Ambulatory Visit (INDEPENDENT_AMBULATORY_CARE_PROVIDER_SITE_OTHER): Payer: BC Managed Care – PPO | Admitting: Women's Health

## 2012-10-25 DIAGNOSIS — N760 Acute vaginitis: Secondary | ICD-10-CM

## 2012-10-25 DIAGNOSIS — N899 Noninflammatory disorder of vagina, unspecified: Secondary | ICD-10-CM

## 2012-10-25 DIAGNOSIS — N898 Other specified noninflammatory disorders of vagina: Secondary | ICD-10-CM

## 2012-10-25 DIAGNOSIS — A499 Bacterial infection, unspecified: Secondary | ICD-10-CM

## 2012-10-25 DIAGNOSIS — B9689 Other specified bacterial agents as the cause of diseases classified elsewhere: Secondary | ICD-10-CM

## 2012-10-25 DIAGNOSIS — R300 Dysuria: Secondary | ICD-10-CM

## 2012-10-25 LAB — URINALYSIS W MICROSCOPIC + REFLEX CULTURE
Bilirubin Urine: NEGATIVE
Glucose, UA: NEGATIVE mg/dL
Protein, ur: NEGATIVE mg/dL
WBC, UA: NONE SEEN WBC/hpf (ref <3)

## 2012-10-25 LAB — WET PREP FOR TRICH, YEAST, CLUE
Trich, Wet Prep: NONE SEEN
Yeast Wet Prep HPF POC: NONE SEEN

## 2012-10-25 MED ORDER — METRONIDAZOLE 0.75 % VA GEL: VAGINAL | Status: DC

## 2012-10-25 NOTE — Progress Notes (Signed)
Patient ID: Tammy Huber, female   DOB: [birth year], 42 y.o.   MRN: [id number] Presents with complaint of vaginal discharge with irritation, burning, mild itching and odor. Slight burning with urination, denies pain at end of stream. Denies abdominal pain, fever. BTL. Has been under increased stress, husband had ruptured intestine from diverticulitis, doing better, hoping to have colostomy reversed.  Exam: Appears well. External genitalia within normal limits, speculum exam  moderate amount of a white adherent discharge with odor noted. Wet prep positive for amines, clues, TNTC bacteria. UA: Rare bacteria, no leukocytes or WBCs..  Bacteria vaginosis  Plan: MetroGel vaginal cream 1 applicator at bedtime x5. Alcohol precautions reviewed. Instructed to call if no relief of symptoms.

## 2012-11-29 ENCOUNTER — Other Ambulatory Visit: Payer: Self-pay

## 2013-01-25 ENCOUNTER — Other Ambulatory Visit: Payer: Self-pay

## 2013-01-25 DIAGNOSIS — N898 Other specified noninflammatory disorders of vagina: Secondary | ICD-10-CM

## 2013-01-25 MED ORDER — FLUCONAZOLE 100 MG PO TABS: ORAL_TABLET | ORAL | Status: DC

## 2013-03-18 ENCOUNTER — Other Ambulatory Visit: Payer: Self-pay

## 2013-03-18 DIAGNOSIS — N898 Other specified noninflammatory disorders of vagina: Secondary | ICD-10-CM

## 2013-03-18 NOTE — Telephone Encounter (Signed)
Disregard previous note, office visit and/or AEX. Review with Britta MccreedyBarbara diflucan is hard on the liver, and should not be having that many yeast infections.

## 2013-03-18 NOTE — Telephone Encounter (Signed)
Ok to call in but have her schedule AEX

## 2013-03-18 NOTE — Telephone Encounter (Signed)
Tammy Huber, Patient got #15 on 01/25/13.

## 2013-03-19 NOTE — Telephone Encounter (Signed)
Patient informed. She explained that she had been very sick and on antibiotics for the last 3 weeks but I explained to her that #15 Diflucan in a month is too much.  Hard on the liver and we may need a different plan if her yeast if really that much of a problem. Rec office visit to come and see [place] to discuss options. Refill refused with pharmacy.

## 2013-03-20 ENCOUNTER — Other Ambulatory Visit: Payer: Self-pay | Admitting: Women's Health

## 2013-03-20 MED ORDER — TERCONAZOLE 0.8 % VA CREA: 1.0000 | TOPICAL_CREAM | Freq: Every day | VAGINAL | Status: DC

## 2013-03-20 NOTE — Telephone Encounter (Signed)
Left detailed message on voice mail and Rx sent.

## 2013-03-20 NOTE — Telephone Encounter (Signed)
Please call and call in Terazol 3 one applicator at bedtime x3, office visit if no relief.

## 2013-04-02 ENCOUNTER — Encounter: Payer: BC Managed Care – PPO | Admitting: Women's Health

## 2013-04-23 ENCOUNTER — Ambulatory Visit (INDEPENDENT_AMBULATORY_CARE_PROVIDER_SITE_OTHER): Payer: BC Managed Care – PPO | Admitting: Gynecology

## 2013-04-23 ENCOUNTER — Encounter: Payer: Self-pay | Admitting: Gynecology

## 2013-04-23 DIAGNOSIS — L293 Anogenital pruritus, unspecified: Secondary | ICD-10-CM

## 2013-04-23 DIAGNOSIS — N898 Other specified noninflammatory disorders of vagina: Secondary | ICD-10-CM

## 2013-04-23 LAB — WET PREP FOR TRICH, YEAST, CLUE
CLUE CELLS WET PREP: NONE SEEN
TRICH WET PREP: NONE SEEN

## 2013-04-23 MED ORDER — FLUCONAZOLE 200 MG PO TABS
200.0000 mg | ORAL_TABLET | Freq: Every day | ORAL | Status: DC
Start: 2013-04-23 — End: 2013-05-07

## 2013-04-23 NOTE — Patient Instructions (Signed)
Take Diflucan pill daily for 5 days. Followup if your symptoms continue, worsen or recur.  Monilial Vaginitis Vaginitis in a soreness, swelling and redness (inflammation) of the vagina and vulva. Monilial vaginitis is not a sexually transmitted infection. CAUSES  Yeast vaginitis is caused by yeast (candida) that is normally found in your vagina. With a yeast infection, the candida has overgrown in number to a point that upsets the chemical balance. SYMPTOMS   White, thick vaginal discharge.  Swelling, itching, redness and irritation of the vagina and possibly the lips of the vagina (vulva).  Burning or painful urination.  Painful intercourse. DIAGNOSIS  Things that may contribute to monilial vaginitis are:  Postmenopausal and virginal states.  Pregnancy.  Infections.  Being tired, sick or stressed, especially if you had monilial vaginitis in the past.  Diabetes. Good control will help lower the chance.  Birth control pills.  Tight fitting garments.  Using bubble bath, feminine sprays, douches or deodorant tampons.  Taking certain medications that kill germs (antibiotics).  Sporadic recurrence can occur if you become ill. TREATMENT  Your caregiver will give you medication.  There are several kinds of anti monilial vaginal creams and suppositories specific for monilial vaginitis. For recurrent yeast infections, use a suppository or cream in the vagina 2 times a week, or as directed.  Anti-monilial or steroid cream for the itching or irritation of the vulva may also be used. Get your caregiver's permission.  Painting the vagina with methylene blue solution may help if the monilial cream does not work.  Eating yogurt may help prevent monilial vaginitis. HOME CARE INSTRUCTIONS   Finish all medication as prescribed.  Do not have sex until treatment is completed or after your caregiver tells you it is okay.  Take warm sitz baths.  Do not douche.  Do not use tampons,  especially scented ones.  Wear cotton underwear.  Avoid tight pants and panty hose.  Tell your sexual partner that you have a yeast infection. They should go to their caregiver if they have symptoms such as mild rash or itching.  Your sexual partner should be treated as well if your infection is difficult to eliminate.  Practice safer sex. Use condoms.  Some vaginal medications cause latex condoms to fail. Vaginal medications that harm condoms are:  Cleocin cream.  Butoconazole (Femstat).  Terconazole (Terazol) vaginal suppository.  Miconazole (Monistat) (may be purchased over the counter). SEEK MEDICAL CARE IF:   You have a temperature by mouth above 102 F (38.9 C).  The infection is getting worse after 2 days of treatment.  The infection is not getting better after 3 days of treatment.  You develop blisters in or around your vagina.  You develop vaginal bleeding, and it is not your menstrual period.  You have pain when you urinate.  You develop intestinal problems.  You have pain with sexual intercourse. Document Released: 10/20/2004 Document Revised: 04/04/2011 Document Reviewed: 07/04/2008 [facility] Patient Information 2014 [employer], [place].

## 2013-04-23 NOTE — Progress Notes (Signed)
Tammy SkillBarbara J Pizzolato 01/30/1970 [id number]        43 y.o.  G2P2 presents with several day history of intense vulvar pruritus and irritation. Used Terazol vaginal cream x3 nights that she had but symptom seemed to be getting worse. No odor or urinary symptoms.  Past medical history,surgical history, problem list, medications, allergies, family history and social history were all reviewed and documented in the EPIC chart.  Exam: Kim assistant General appearance  Normal External BUS vagina with generalized erythema and thick cakey discharge. Cervix normal. Uterus normal size midline mobile nontender. Adnexa without masses or tenderness.  Assessment/Plan:  43 y.o. G2P2 with yeast vulva vaginitis. Wet prep is positive for yeast. Unusual not to respond to Calpine Corporationerazol. We'll treat more aggressively with Diflucan 200 mg daily x5 days. Followup if symptoms persist, worsen or recur.   Note: This document was prepared with digital dictation and possible smart phrase technology. Any transcriptional errors that result from this process are unintentional.   Dara LordsFONTAINE,Lavren Lewan P MD, 2:30 PM 04/23/2013

## 2013-05-07 ENCOUNTER — Encounter: Payer: Self-pay | Admitting: Women's Health

## 2013-05-07 ENCOUNTER — Ambulatory Visit (INDEPENDENT_AMBULATORY_CARE_PROVIDER_SITE_OTHER): Payer: BC Managed Care – PPO | Admitting: Women's Health

## 2013-05-07 VITALS — BP 118/76 | Ht 61.0 in | Wt 198.0 lb

## 2013-05-07 DIAGNOSIS — Z833 Family history of diabetes mellitus: Secondary | ICD-10-CM

## 2013-05-07 DIAGNOSIS — Z01419 Encounter for gynecological examination (general) (routine) without abnormal findings: Secondary | ICD-10-CM

## 2013-05-07 DIAGNOSIS — E079 Disorder of thyroid, unspecified: Secondary | ICD-10-CM

## 2013-05-07 DIAGNOSIS — Z13220 Encounter for screening for lipoid disorders: Secondary | ICD-10-CM

## 2013-05-07 LAB — CBC WITH DIFFERENTIAL/PLATELET
Basophils Absolute: 0.1 10*3/uL (ref 0.0–0.1)
Basophils Relative: 1 % (ref 0–1)
Eosinophils Absolute: 0.2 10*3/uL (ref 0.0–0.7)
Eosinophils Relative: 2 % (ref 0–5)
HCT: 41.3 % (ref 36.0–46.0)
Hemoglobin: 13.7 g/dL (ref 12.0–15.0)
LYMPHS PCT: 33 % (ref 12–46)
Lymphs Abs: 2.8 10*3/uL (ref 0.7–4.0)
MCH: 28.2 pg (ref 26.0–34.0)
MCHC: 33.2 g/dL (ref 30.0–36.0)
MCV: 85 fL (ref 78.0–100.0)
Monocytes Absolute: 0.4 10*3/uL (ref 0.1–1.0)
Monocytes Relative: 5 % (ref 3–12)
NEUTROS ABS: 5 10*3/uL (ref 1.7–7.7)
Neutrophils Relative %: 59 % (ref 43–77)
PLATELETS: 268 10*3/uL (ref 150–400)
RBC: 4.86 MIL/uL (ref 3.87–5.11)
RDW: 14.6 % (ref 11.5–15.5)
WBC: 8.4 10*3/uL (ref 4.0–10.5)

## 2013-05-07 LAB — LIPID PANEL
CHOLESTEROL: 153 mg/dL (ref 0–200)
HDL: 44 mg/dL (ref >39)
LDL Cholesterol: 98 mg/dL (ref 0–99)
Total CHOL/HDL Ratio: 3.5 Ratio
Triglycerides: 55 mg/dL (ref <150)
VLDL: 11 mg/dL (ref 0–40)

## 2013-05-07 LAB — GLUCOSE, RANDOM: GLUCOSE: 80 mg/dL (ref 70–99)

## 2013-05-07 LAB — TSH: TSH: 1.537 u[IU]/mL (ref 0.350–4.500)

## 2013-05-07 NOTE — Patient Instructions (Signed)

## 2013-05-07 NOTE — Addendum Note (Signed)
Addended by: Marijo SanesFALLS, Charmeka [place] W on: 05/07/2013 03:07 PM   Modules accepted: Orders

## 2013-05-07 NOTE — Progress Notes (Addendum)
Tammy SkillBarbara J Huber 04/01/1970 [id number]    History:    Presents for annual exam. Monthly cycle, BTL. History of chronic pelvic pain, now only with intercourse, had a laparoscopic surgery that noted numerous adhesions in 2010. Long-term history of anxiety and depression, sees a Veterinary surgeoncounselor and psychiatrist for medication. Is a smoker and is aware of hazards of smoking. Has had problems with recurrent BV and yeast. History of normal Paps. Has not had a mammogram, counseled on scheduling. Poison ivy on both arms, will follow up with primary care.  Past medical history, past surgical history, family history and social history were all reviewed and documented in the EPIC chart. 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 One granddaughter.   ROS:  A  ROS was performed and pertinent positives and negatives are included.  Exam:  Filed Vitals:   05/07/13 1205  BP: 118/76    General appearance:  Normal, appears well Thyroid:  Symmetrical, normal in size, without palpable masses or nodularity. Respiratory  Auscultation:  Clear without wheezing or rhonchi Cardiovascular  Auscultation:  Regular rate, without rubs, murmurs or gallops  Edema/varicosities:  Not grossly evident Abdominal  Soft,nontender, without masses, guarding or rebound.  Liver/spleen:  No organomegaly noted  Hernia:  None appreciated  Skin  Inspection:  Grossly normal, erythematous lesions b/l distal arms   Breasts: Examined lying and sitting.     Right: Without masses, retractions, discharge or axillary adenopathy.     Left: Without masses, retractions, discharge or axillary adenopathy. Gentitourinary   Inguinal/mons:  Normal without inguinal adenopathy  External genitalia:  Normal  BUS/Urethra/Skene's glands:  Normal  Vagina:  Normal  Cervix:  Normal  Uterus:  normal in size, shape and contour.  Midline and mobile  Adnexa/parametria:     Rt: Without masses or tenderness.   Lt: Without masses or tenderness.  Anus and  perineum: Normal  Digital rectal exam: Normal sphincter tone without palpated masses or tenderness  Assessment/Plan:  43 y.o. MWF G2P2 for annual exam with no complaints.     Chronic pelvic pain now only dyspareunia Anxiety/depression-stable meds per psychiatrist  Smoker Obesity Monthly cycles/BTL  Plan: SBE's, instructed to schedule mammogram and to have annually. Increase exercise and decrease calories for weight loss. Normal Pap 2014, new screening guidelines reviewed. UA, lipid panel, TSH, glucose, CBC.   Harrington Challengerancy [facility], TheWHNP, 1:01 PM 05/07/2013

## 2013-05-08 ENCOUNTER — Encounter: Payer: Self-pay | Admitting: Women's Health

## 2013-05-08 LAB — URINALYSIS W MICROSCOPIC + REFLEX CULTURE
Bacteria, UA: NONE SEEN
Bilirubin Urine: NEGATIVE
CASTS: NONE SEEN
Crystals: NONE SEEN
GLUCOSE, UA: NEGATIVE mg/dL
Hgb urine dipstick: NEGATIVE
KETONES UR: NEGATIVE mg/dL
LEUKOCYTES UA: NEGATIVE
Nitrite: NEGATIVE
PH: 7 (ref 5.0–8.0)
Protein, ur: NEGATIVE mg/dL
Specific Gravity, Urine: 1.022 (ref 1.005–1.030)
Urobilinogen, UA: 0.2 mg/dL (ref 0.0–1.0)

## 2013-05-15 ENCOUNTER — Ambulatory Visit: Payer: BC Managed Care – PPO | Admitting: Women's Health

## 2013-05-22 ENCOUNTER — Ambulatory Visit: Payer: BC Managed Care – PPO | Admitting: Women's Health

## 2013-07-06 ENCOUNTER — Other Ambulatory Visit: Payer: Self-pay | Admitting: Gynecology

## 2013-07-08 NOTE — Telephone Encounter (Signed)
Please call ok to refill if no relief office visit

## 2013-08-14 ENCOUNTER — Other Ambulatory Visit: Payer: Self-pay | Admitting: Women's Health

## 2013-08-14 ENCOUNTER — Emergency Department (HOSPITAL_COMMUNITY): Payer: BC Managed Care – PPO

## 2013-08-14 ENCOUNTER — Emergency Department (HOSPITAL_COMMUNITY)
Admission: EM | Admit: 2013-08-14 | Discharge: 2013-08-14 | Disposition: A | Discharge status: Home / Self Care | Payer: BC Managed Care – PPO | Attending: Emergency Medicine | Admitting: Emergency Medicine

## 2013-08-14 ENCOUNTER — Encounter (HOSPITAL_COMMUNITY): Payer: Self-pay | Admitting: Emergency Medicine

## 2013-08-14 ENCOUNTER — Ambulatory Visit (INDEPENDENT_AMBULATORY_CARE_PROVIDER_SITE_OTHER): Payer: BC Managed Care – PPO

## 2013-08-14 ENCOUNTER — Ambulatory Visit (INDEPENDENT_AMBULATORY_CARE_PROVIDER_SITE_OTHER): Payer: BC Managed Care – PPO | Admitting: Women's Health

## 2013-08-14 DIAGNOSIS — Z3202 Encounter for pregnancy test, result negative: Secondary | ICD-10-CM | POA: Insufficient documentation

## 2013-08-14 DIAGNOSIS — R339 Retention of urine, unspecified: Secondary | ICD-10-CM

## 2013-08-14 DIAGNOSIS — R1031 Right lower quadrant pain: Secondary | ICD-10-CM

## 2013-08-14 DIAGNOSIS — G8929 Other chronic pain: Secondary | ICD-10-CM | POA: Insufficient documentation

## 2013-08-14 DIAGNOSIS — Z9889 Other specified postprocedural states: Secondary | ICD-10-CM | POA: Insufficient documentation

## 2013-08-14 DIAGNOSIS — F17200 Nicotine dependence, unspecified, uncomplicated: Secondary | ICD-10-CM | POA: Insufficient documentation

## 2013-08-14 DIAGNOSIS — Z79899 Other long term (current) drug therapy: Secondary | ICD-10-CM | POA: Insufficient documentation

## 2013-08-14 DIAGNOSIS — N898 Other specified noninflammatory disorders of vagina: Secondary | ICD-10-CM | POA: Insufficient documentation

## 2013-08-14 DIAGNOSIS — Z8659 Personal history of other mental and behavioral disorders: Secondary | ICD-10-CM | POA: Insufficient documentation

## 2013-08-14 LAB — CBC WITH DIFFERENTIAL/PLATELET
BASOS ABS: 0 10*3/uL (ref 0.0–0.1)
BASOS PCT: 0 % (ref 0–1)
Eosinophils Absolute: 0.2 10*3/uL (ref 0.0–0.7)
Eosinophils Relative: 2 % (ref 0–5)
HEMATOCRIT: 44.3 % (ref 36.0–46.0)
HEMOGLOBIN: 14.8 g/dL (ref 12.0–15.0)
LYMPHS PCT: 41 % (ref 12–46)
Lymphs Abs: 3.9 10*3/uL (ref 0.7–4.0)
MCH: 29.2 pg (ref 26.0–34.0)
MCHC: 33.4 g/dL (ref 30.0–36.0)
MCV: 87.5 fL (ref 78.0–100.0)
MONO ABS: 0.4 10*3/uL (ref 0.1–1.0)
Monocytes Relative: 5 % (ref 3–12)
NEUTROS ABS: 4.8 10*3/uL (ref 1.7–7.7)
Neutrophils Relative %: 52 % (ref 43–77)
Platelets: 236 10*3/uL (ref 150–400)
RBC: 5.06 MIL/uL (ref 3.87–5.11)
RDW: 14 % (ref 11.5–15.5)
WBC: 9.3 10*3/uL (ref 4.0–10.5)

## 2013-08-14 LAB — URINALYSIS, ROUTINE W REFLEX MICROSCOPIC
Bilirubin Urine: NEGATIVE
Glucose, UA: NEGATIVE mg/dL
Ketones, ur: NEGATIVE mg/dL
LEUKOCYTES UA: NEGATIVE
NITRITE: NEGATIVE
PROTEIN: NEGATIVE mg/dL
Specific Gravity, Urine: 1.014 (ref 1.005–1.030)
UROBILINOGEN UA: 0.2 mg/dL (ref 0.0–1.0)
pH: 6 (ref 5.0–8.0)

## 2013-08-14 LAB — WET PREP FOR TRICH, YEAST, CLUE
CLUE CELLS WET PREP: NONE SEEN
TRICH WET PREP: NONE SEEN
WBC, Wet Prep HPF POC: NONE SEEN
YEAST WET PREP: NONE SEEN

## 2013-08-14 LAB — COMPREHENSIVE METABOLIC PANEL
ALBUMIN: 4.2 g/dL (ref 3.5–5.2)
ALK PHOS: 78 U/L (ref 39–117)
ALT: 13 U/L (ref 0–35)
AST: 18 U/L (ref 0–37)
Anion gap: 14 (ref 5–15)
BUN: 9 mg/dL (ref 6–23)
CO2: 23 meq/L (ref 19–32)
Calcium: 9.4 mg/dL (ref 8.4–10.5)
Chloride: 101 mEq/L (ref 96–112)
Creatinine, Ser: 0.62 mg/dL (ref 0.50–1.10)
GFR calc Af Amer: >90 mL/min (ref >90)
Glucose, Bld: 85 mg/dL (ref 70–99)
POTASSIUM: 4.2 meq/L (ref 3.7–5.3)
Sodium: 138 mEq/L (ref 137–147)
Total Protein: 7.7 g/dL (ref 6.0–8.3)

## 2013-08-14 LAB — URINE MICROSCOPIC-ADD ON

## 2013-08-14 LAB — LIPASE, BLOOD: Lipase: 15 U/L (ref 11–59)

## 2013-08-14 LAB — POC URINE PREG, ED: PREG TEST UR: NEGATIVE

## 2013-08-14 MED ORDER — ONDANSETRON 8 MG PO TBDP
8.0000 mg | ORAL_TABLET | Freq: Once | ORAL | Status: AC
  Administered 2013-08-14: 8 mg via ORAL
  Filled 2013-08-14: qty 1

## 2013-08-14 MED ORDER — METHYLPREDNISOLONE SODIUM SUCC 125 MG IJ SOLR: 125.0000 mg | Freq: Once | INTRAMUSCULAR | Status: DC

## 2013-08-14 MED ORDER — FAMOTIDINE IN NACL 20-0.9 MG/50ML-% IV SOLN: 20.0000 mg | Freq: Once | INTRAVENOUS | Status: DC

## 2013-08-14 MED ORDER — MORPHINE SULFATE 4 MG/ML IJ SOLN
4.0000 mg | Freq: Once | INTRAMUSCULAR | Status: AC
  Administered 2013-08-14: 4 mg via INTRAVENOUS
  Filled 2013-08-14: qty 1

## 2013-08-14 MED ORDER — ONDANSETRON HCL 4 MG/2ML IJ SOLN
4.0000 mg | Freq: Once | INTRAMUSCULAR | Status: AC
  Administered 2013-08-14: 4 mg via INTRAVENOUS
  Filled 2013-08-14: qty 2

## 2013-08-14 MED ORDER — OXYCODONE-ACETAMINOPHEN 5-325 MG PO TABS: 1.0000 | ORAL_TABLET | Freq: Four times a day (QID) | ORAL | Status: DC | PRN

## 2013-08-14 MED ORDER — IOHEXOL 300 MG/ML  SOLN
50.0000 mL | Freq: Once | INTRAMUSCULAR | Status: AC | PRN
  Administered 2013-08-14: 50 mL via ORAL

## 2013-08-14 MED ORDER — IOHEXOL 300 MG/ML  SOLN
100.0000 mL | Freq: Once | INTRAMUSCULAR | Status: AC | PRN
  Administered 2013-08-14: 100 mL via INTRAVENOUS

## 2013-08-14 MED ORDER — SODIUM CHLORIDE 0.9 % IV BOLUS (SEPSIS): 1000.0000 mL | Freq: Once | INTRAVENOUS | Status: DC

## 2013-08-14 MED ORDER — OXYCODONE-ACETAMINOPHEN 5-325 MG PO TABS
2.0000 | ORAL_TABLET | Freq: Once | ORAL | Status: AC
Start: 2013-08-14 — End: 2013-08-14
  Administered 2013-08-14: 2 via ORAL
  Filled 2013-08-14: qty 2

## 2013-08-14 MED ORDER — DIPHENHYDRAMINE HCL 50 MG/ML IJ SOLN: 50.0000 mg | Freq: Once | INTRAMUSCULAR | Status: DC

## 2013-08-14 MED ORDER — ONDANSETRON HCL 4 MG PO TABS: 4.0000 mg | ORAL_TABLET | Freq: Four times a day (QID) | ORAL | Status: DC

## 2013-08-14 NOTE — ED Notes (Signed)
Pt c/o right lower abd pain that started yesterday morning, states she also started bleeding this morning.

## 2013-08-14 NOTE — ED Notes (Signed)
Patient transported to CT 

## 2013-08-14 NOTE — ED Provider Notes (Signed)
CSN: [id number]     Arrival date & time 08/14/13  1706 History   First MD Initiated Contact with Patient 08/14/13 1715     Chief Complaint  Patient presents with  . Abdominal Pain    right     (Consider location/radiation/quality/duration/timing/severity/associated sxs/prior Treatment) HPI Comments: Patient is a 43 year old female with history of chronic pelvic pain, bipolar 1 disorder, anxiety, depression who presents to the emergency department today with 2 days of right lower quadrant pain. She states this began yesterday and is a burning pain. It was worse when she woke up this morning. She has associated vaginal spotting. She denies fever, chills, nausea, vomiting, diarrhea. She still has her appendix. She was seen by her OB/GYN earlier today. A pelvic exam and transvaginal ultrasound was performed. Her OB/GYN sent her to the emergency department to rule out appendicitis. Her OB/GYN physician does not believe that this is a GYN complaints.  Patient is a 43 y.o. female presenting with abdominal pain. The history is provided by the patient. No language interpreter was used.  Abdominal Pain Associated symptoms: vaginal bleeding   Associated symptoms: no chest pain, no chills, no diarrhea, no fever, no nausea, no shortness of breath and no vomiting     Past Medical History  Diagnosis Date  . Pain, female pelvic     since 1990  . Smoker   . Anxiety   . Depression   . Bipolar 1 disorder    Past Surgical History  Procedure Laterality Date  . Tubal ligation  1998  . Cesarean section  1990 and 1998  . Spinal fusion  2005    lower back  . Foot arthrodesis, modified mcbride  2003    right foot  . Xlap rso  2010    extensive adhesions  . Pelvic laparoscopy      RSO   Family History  Problem Relation Age of Onset  . Breast cancer Maternal Grandmother     after age 31   History  Substance Use Topics  . Smoking status: Current Every Day Smoker -- 0.25 packs/day  . Smokeless  tobacco: Never Used  . Alcohol Use: Yes     Comment: occassionally   OB History   Grav Para Term Preterm Abortions TAB SAB Ect Mult Living   2 2        2      Review of Systems  Constitutional: Negative for fever and chills.  Respiratory: Negative for shortness of breath.   Cardiovascular: Negative for chest pain.  Gastrointestinal: Positive for abdominal pain. Negative for nausea, vomiting and diarrhea.  Genitourinary: Positive for vaginal bleeding.  All other systems reviewed and are negative.     Allergies  Codeine  Home Medications   Prior to Admission medications   Medication Sig Start Date End Date Taking? Authorizing Provider  esomeprazole (NEXIUM) 40 MG capsule Take 40 mg by mouth daily before breakfast.     Yes Historical Provider, MD   BP 121/78  Pulse 83  Temp(Src) 98.7 F (37.1 C) (Oral)  Resp 18  SpO2 99%  LMP 07/22/2013 Physical Exam  Nursing note and vitals reviewed. Constitutional: She is oriented to person, place, and time. She appears well-developed and well-nourished. No distress.  HENT:  Head: Normocephalic and atraumatic.  Right Ear: External ear normal.  Left Ear: External ear normal.  Nose: Nose normal.  Mouth/Throat: Oropharynx is clear and moist.  Eyes: Conjunctivae are normal.  Neck: Normal range of motion.  Cardiovascular: Normal rate,  regular rhythm and normal heart sounds.   Pulmonary/Chest: Effort normal and breath sounds normal. No stridor. No respiratory distress. She has no wheezes. She has no rales.  Abdominal: Soft. She exhibits no distension. There is tenderness in the right lower quadrant. There is no rigidity, no rebound and no guarding.  Musculoskeletal: Normal range of motion.  Neurological: She is alert and oriented to person, place, and time. She has normal strength.  Skin: Skin is warm and dry. She is not diaphoretic. No erythema.  Psychiatric: She has a normal mood and affect. Her behavior is normal.    ED Course   Procedures (including critical care time) Labs Review Labs Reviewed  COMPREHENSIVE METABOLIC PANEL - Abnormal; Notable for the following:    Total Bilirubin <0.2 (*)    All other components within normal limits  URINALYSIS, ROUTINE W REFLEX MICROSCOPIC - Abnormal; Notable for the following:    Hgb urine dipstick SMALL (*)    All other components within normal limits  URINE CULTURE  CBC WITH DIFFERENTIAL  LIPASE, BLOOD  URINE MICROSCOPIC-ADD ON  POC URINE PREG, ED    Imaging Review Ct Abdomen Pelvis W Contrast  08/14/2013   CLINICAL DATA:  Right lower quadrant pain.  EXAM: CT ABDOMEN AND PELVIS WITH CONTRAST  TECHNIQUE: Multidetector CT imaging of the abdomen and pelvis was performed using the standard protocol following bolus administration of intravenous contrast.  CONTRAST:  100mL OMNIPAQUE IOHEXOL 300 MG/ML SOLN, 50mL OMNIPAQUE IOHEXOL 300 MG/ML SOLN  COMPARISON:  07/23/2007  FINDINGS: Lung Bases: Unremarkable.  Liver:  No focal abnormality. No evidence for hepatomegaly.  Spleen: No splenomegaly. No focal mass lesion.  Stomach: Nondistended. No gastric wall thickening. No evidence of outlet obstruction.  Pancreas: No focal mass lesion. No dilatation of the main duct. No intraparenchymal cyst. No peripancreatic edema.  Gallbladder/Biliary: No evidence for gallstones. No pericholecystic fluid. No intrahepatic or extrahepatic biliary dilation.  Kidneys/Adrenals: No adrenal nodule. Kidneys are normal in appearance bilaterally.  Bowel Loops: Duodenum is normally positioned as is the ligament of Treitz. No small bowel obstruction. Terminal ileum is normal. The appendix is normal. No abnormality in the colon by CT. No colonic diverticulosis or diverticulitis.  Nodes: No intraperitoneal or retroperitoneal lymphadenopathy in the abdomen. No evidence for pelvic sidewall lymphadenopathy.  Vasculature: No abdominal aortic aneurysm.  Pelvic Genitourinary: Bladder is decompressed. Head originating in knee  uterine myometrium may be related to fibroids. There is no adnexal mass.  Bones/Musculoskeletal: Bone windows reveal no worrisome lytic or sclerotic osseous lesions. Degenerative changes seen at L5-S1.  Body Wall: No evidence for abdominal wall hernia.  Other: No intraperitoneal free fluid.  IMPRESSION: No acute findings in the abdomen or pelvis. Specifically, no findings to explain the patient's history of right lower quadrant pain.   Electronically Signed   By: Kennith CenterEric  Mansell M.D.   On: 08/14/2013 19:26     EKG Interpretation None      MDM   Final diagnoses:  Right lower quadrant abdominal pain   Patient is nontoxic, nonseptic appearing, in no apparent distress.  Patient's pain and other symptoms adequately managed in emergency department.  Labs, imaging and vitals reviewed.  Patient does not meet the SIRS or Sepsis criteria.  On repeat exam patient does not have a surgical abdomen and there are no peritoneal signs.  No indication of appendicitis, bowel obstruction, bowel perforation, cholecystitis, diverticulitis, PID or ectopic pregnancy. Patient with transvaginal US done earlier today which was grossly normal. Wet prep completely negative  earlier. Patient discharged home with symptomatic treatment and given strict instructions for follow-up with their primary care physician.  I have also discussed reasons to return immediately to the ER.  Patient expresses understanding and agrees with plan.      Mora Bellman, PA-C 08/15/13 [phone]

## 2013-08-14 NOTE — ED Notes (Signed)
PA at bedside.

## 2013-08-14 NOTE — Discharge Instructions (Signed)
Abdominal Pain, Women °Abdominal (stomach, pelvic, or belly) pain can be caused by many things. It is important to tell your doctor: °· The location of the pain. °· Does it come and go or is it present all the time? °· Are there things that start the pain (eating certain foods, exercise)? °· Are there other symptoms associated with the pain (fever, nausea, vomiting, diarrhea)? °All of this is helpful to know when trying to find the cause of the pain. °CAUSES  °· Stomach: virus or bacteria infection, or ulcer. °· Intestine: appendicitis (inflamed appendix), regional ileitis (Crohn's disease), ulcerative colitis (inflamed colon), irritable bowel syndrome, diverticulitis (inflamed diverticulum of the colon), or cancer of the stomach or intestine. °· Gallbladder disease or stones in the gallbladder. °· Kidney disease, kidney stones, or infection. °· Pancreas infection or cancer. °· Fibromyalgia (pain disorder). °· Diseases of the female organs: °¨ Uterus: fibroid (non-cancerous) tumors or infection. °¨ Fallopian tubes: infection or tubal pregnancy. °¨ Ovary: cysts or tumors. °¨ Pelvic adhesions (scar tissue). °¨ Endometriosis (uterus lining tissue growing in the pelvis and on the pelvic organs). °¨ Pelvic congestion syndrome (female organs filling up with blood just before the menstrual period). °¨ Pain with the menstrual period. °¨ Pain with ovulation (producing an egg). °¨ Pain with an IUD (intrauterine device, birth control) in the uterus. °¨ Cancer of the female organs. °· Functional pain (pain not caused by a disease, may improve without treatment). °· Psychological pain. °· Depression. °DIAGNOSIS  °Your doctor will decide the seriousness of your pain by doing an examination. °· Blood tests. °· X-rays. °· Ultrasound. °· CT scan (computed tomography, special type of X-ray). °· MRI (magnetic resonance imaging). °· Cultures, for infection. °· Barium enema (dye inserted in the large intestine, to better view it with  X-rays). °· Colonoscopy (looking in intestine with a lighted tube). °· Laparoscopy (minor surgery, looking in abdomen with a lighted tube). °· Major abdominal exploratory surgery (looking in abdomen with a large incision). °TREATMENT  °The treatment will depend on the cause of the pain.  °· Many cases can be observed and treated at home. °· Over-the-counter medicines recommended by your caregiver. °· Prescription medicine. °· Antibiotics, for infection. °· Birth control pills, for painful periods or for ovulation pain. °· Hormone treatment, for endometriosis. °· Nerve blocking injections. °· Physical therapy. °· Antidepressants. °· Counseling with a psychologist or psychiatrist. °· Minor or major surgery. °HOME CARE INSTRUCTIONS  °· Do not take laxatives, unless directed by your caregiver. °· Take over-the-counter pain medicine only if ordered by your caregiver. Do not take aspirin because it can cause an upset stomach or bleeding. °· Try a clear liquid diet (broth or water) as ordered by your caregiver. Slowly move to a bland diet, as tolerated, if the pain is related to the stomach or intestine. °· Have a thermometer and take your temperature several times a day, and record it. °· Bed rest and sleep, if it helps the pain. °· Avoid sexual intercourse, if it causes pain. °· Avoid stressful situations. °· Keep your follow-up appointments and tests, as your caregiver orders. °· If the pain does not go away with medicine or surgery, you may try: °¨ Acupuncture. °¨ Relaxation exercises (yoga, meditation). °¨ Group therapy. °¨ Counseling. °SEEK MEDICAL CARE IF:  °· You notice certain foods cause stomach pain. °· Your home care treatment is not helping your pain. °· You need stronger pain medicine. °· You want your IUD removed. °· You feel faint or   lightheaded. °· You develop nausea and vomiting. °· You develop a rash. °· You are having side effects or an allergy to your medicine. °SEEK IMMEDIATE MEDICAL CARE IF:  °· Your  pain does not go away or gets worse. °· You have a fever. °· Your pain is felt only in portions of the abdomen. The right side could possibly be appendicitis. The left lower portion of the abdomen could be colitis or diverticulitis. °· You are passing blood in your stools (bright red or black tarry stools, with or without vomiting). °· You have blood in your urine. °· You develop chills, with or without a fever. °· You pass out. °MAKE SURE YOU:  °· Understand these instructions. °· Will watch your condition. °· Will get help right away if you are not doing well or get worse. °Document Released: 11/07/2006 Document Revised: 05/27/2013 Document Reviewed: 11/27/2008 °ExitCare® Patient Information ©2015 [employer]. This information is not intended to replace advice given to you by your health care provider. Make sure you discuss any questions you have with your health care provider. ° °

## 2013-08-14 NOTE — Progress Notes (Signed)
Patient ID: Tammy SkillBarbara J Dula, female   DOB: 05/14/1970, 43 y.o.   MRN: [id number] Presents with intense right lower quadrant pain that started yesterday, no relief with Vicodin. Burning sensation lower right quadrant x1 earlier today.  RSO. Normal cycle  2-1/2 weeks ago, has had bloody discharge today. BTL/same partner. Denies urinary symptoms, N/V, constipation, fever, no changes in elimination. History of a kidney stone, pain completely different.  Exam: Appears uncomfortable. Abdomen soft no rebound, tenderness in right lower quadrant.Guarded. External genitalia within normal limits, speculum exam scant menses type bleeding noted, wet prep negative. Bimanual slight CMT, no fullness left, right adnexa very tender. No CVAT Ultrasound: Transvaginal and transabdominal images. Anteverted enlarged uterus with homogeneous echo. Right adnexa no apparent mass, no right ovary remnant seen. Left ovary normal. Previous hydrosalpinx noted. Negative cul-de-sac. Noted bowel shadowing area of pain. (Reports pain increased after ultrasound probe.)  Right lower quadrant pain rule out appendicitis  Plan: [facility] for possible [facility]. Husband is with patient and will drive. [facility] and alerted of [facility] history.

## 2013-08-14 NOTE — ED Notes (Addendum)
Pt reports RLQ pain since yesterday pain 10/10. Pt hx tubes tied and only has 1 ovary. Last menstrual cycle on 6/29. Pt reports vaginal bleeding, not on her period. Pt reports hx of kidney stones and says the pain is not similar to that.hx of chronic pelvic pain, reports pain is not similar to that pain either. [facility] who [facility] ultrasound and found nothing, suspected appendix and that she might be retaining urine. Pt denies trauma or injury.

## 2013-08-15 LAB — URINE CULTURE
CULTURE: NO GROWTH
Colony Count: NO GROWTH

## 2013-08-15 NOTE — ED Provider Notes (Signed)
Medical screening examination/treatment/procedure(s) were performed by non-physician practitioner and as supervising physician I was immediately available for consultation/collaboration.  Lucion Dilger T Kember Boch, MD 08/15/13 2139 

## 2013-09-11 ENCOUNTER — Telehealth: Payer: Self-pay | Admitting: *Deleted

## 2013-09-11 ENCOUNTER — Ambulatory Visit (INDEPENDENT_AMBULATORY_CARE_PROVIDER_SITE_OTHER): Payer: BC Managed Care – PPO | Admitting: Women's Health

## 2013-09-11 ENCOUNTER — Encounter: Payer: Self-pay | Admitting: Women's Health

## 2013-09-11 DIAGNOSIS — N951 Menopausal and female climacteric states: Secondary | ICD-10-CM

## 2013-09-11 DIAGNOSIS — G8929 Other chronic pain: Secondary | ICD-10-CM

## 2013-09-11 DIAGNOSIS — R1031 Right lower quadrant pain: Principal | ICD-10-CM

## 2013-09-11 LAB — FOLLICLE STIMULATING HORMONE: FSH: 93.2 m[IU]/mL

## 2013-09-11 NOTE — Telephone Encounter (Signed)
REFERRAL PLACED FOR [facility], THEY WILL CONTACT PATIENT TO SCHEDULE.

## 2013-09-11 NOTE — Patient Instructions (Signed)

## 2013-09-11 NOTE — Progress Notes (Signed)
Patient ID: Tammy Huber, female   DOB: 02/01/1970, [age] y.o.   MRN: [id number] Presents with complaint of persistent right lower quadrant pain for 6 weeks. Was seen in the office July 17 for severe right lower quadrant pain, had a negative gyn ultrasound, [facility] had a negative CT scan. Reports pain is more severe in the a.m. rates at age 229-10, decreases to a 7 by noon and 6 by evening. LMP 7/23, BTL. Regular monthly cycles prior, has had some increased hot flushes. Denies any change in routine or diet. Has had some nausea with vomiting, not daily. States feels a lump in the right lower quadrant in the groin area. Denies vaginal discharge, urinary symptoms, fever. History of chronic pelvic pain, adhesions but reports pain as different.   Exam: Appears uncomfortable, abdomen obese, no rebound or radiation of pain, no palpable nodule or mass noted. Tenderness right groin.   Questionable right inguinal hernia Persistent right lower quadrant/groin pain - 6 weeks  Plan: Surgical consult for questionable inguinal hernia. [facility]SH, instructed to call if no cycle by end of September.

## 2013-09-11 NOTE — Telephone Encounter (Signed)
Message copied by Aura CampsWEBB, JENNIFER L on Wed Sep 11, 2013  9:29 AM ------      Message from: [place], [place] J      Created: Wed Sep 11, 2013  8:11 AM       Please schedule surgical consult for eval of possible rt inquinal pain for 6 weeks, neg CT scan 08/09/2013. Pain persistent, worse in am, some nausea with vomiting, no constipation, no fever, neg gyn us  ------

## 2013-09-12 ENCOUNTER — Encounter: Payer: Self-pay | Admitting: Women's Health

## 2013-09-12 NOTE — Telephone Encounter (Signed)
APPOINTMENT 09/17/13 @ 9:00 AM WITH DR. Gerrit FriendsGERKIN

## 2013-09-17 ENCOUNTER — Ambulatory Visit (INDEPENDENT_AMBULATORY_CARE_PROVIDER_SITE_OTHER): Payer: BC Managed Care – PPO | Admitting: Surgery

## 2013-09-17 ENCOUNTER — Encounter (INDEPENDENT_AMBULATORY_CARE_PROVIDER_SITE_OTHER): Payer: Self-pay | Admitting: Surgery

## 2013-09-17 VITALS — BP 116/82 | HR 87 | Temp 97.6°F | Ht 62.0 in | Wt 196.2 lb

## 2013-09-17 DIAGNOSIS — R109 Unspecified abdominal pain: Secondary | ICD-10-CM

## 2013-09-17 DIAGNOSIS — R1031 Right lower quadrant pain: Secondary | ICD-10-CM

## 2013-09-17 NOTE — Progress Notes (Signed)
General Surgery [facility]  Chief Complaint  Patient presents with  . New Evaluation    RLQ abdominal pain - rule out hernia - referral from Maryelizabeth Rowan, NP    HISTORY: Patient is a pleasant 43 year old female referred by her primary care provider for evaluation of right groin pain. Patient noted sudden onset approximately 7 weeks ago. She does not note any inciting factors. There was no unusual physical activity or injury. Patient denies any concurrent abdominal symptoms including no fever, chills, nausea, vomiting, or diarrhea. At one point the patient thought she felt a "lump" in the right groin. This lasted approximately 2 days and resolved and has not recurred.  Past surgical history notable for right oophorectomy in 2012 for "cyst". 2 cesarean sections. Low back surgery approximately 10 years ago by unknown provider.  Patient was seen in the emergency department on July 22. CT scan of the abdomen and pelvis was obtained at that time and showed no acute abnormality and no evidence of hernia.  Patient presents today for evaluation to rule out hernia and for recommendations regarding right groin pain.  Past Medical History  Diagnosis Date  . Pain, female pelvic     since 1990  . Smoker   . Anxiety   . Depression   . Bipolar 1 disorder     Current Outpatient Prescriptions  Medication Sig Dispense Refill  . esomeprazole (NEXIUM) 40 MG capsule Take 40 mg by mouth daily before breakfast.         No current facility-administered medications for this visit.    Allergies  Allergen Reactions  . Codeine Rash    Family History  Problem Relation Age of Onset  . Breast cancer Maternal Grandmother     after age 49    History   Social History  . Marital Status: Married    Spouse Name: N/A    Number of Children: N/A  . Years of Education: N/A   Social History Main Topics  . Smoking status: Current Every Day Smoker -- 0.25 packs/day  . Smokeless  tobacco: Never Used  . Alcohol Use: Yes     Comment: occassionally  . Drug Use: No  . Sexual Activity: Yes    Partners: Male    Birth Control/ Protection: Surgical     Comment: BTL   Other Topics Concern  . None   Social History Narrative  . None    REVIEW OF SYSTEMS - PERTINENT POSITIVES ONLY: Denies nausea, vomiting, fever, chills, diarrhea. No recurrent "lump" in the right groin. No symptoms on the left side.  Patient describes pain which is poorly localized in the right groin, right flank, right hip, and lateral right thigh.  EXAM: Filed Vitals:   09/17/13 0912  BP: 116/82  Pulse: 87  Temp: 97.6 F (36.4 C)    GENERAL: well-developed, well-nourished, no acute distress HEENT: normocephalic; pupils equal and reactive; sclerae clear; dentition good; mucous membranes moist NECK:  No palpable masses in the thyroid bed; symmetric on extension; no palpable anterior or posterior cervical lymphadenopathy; no supraclavicular masses; no tenderness CHEST: clear to auscultation bilaterally without rales, rhonchi, or wheezes CARDIAC: regular rate and rhythm without significant murmur; peripheral pulses are full ABDOMEN: soft without distension; bowel sounds present; no mass; no hepatosplenomegaly; well-healed incision in the umbilicus and in the Pfannenstiel location; with cough, Valsalva, and set up maneuver both N/A recombinant and a standing position there is no evidence of inguinal or femoral hernia EXT:  non-tender  without edema; no deformity NEURO: no gross focal deficits; no sign of tremor; negative straight leg raise test bilaterally   LABORATORY RESULTS: See [facility] ([facility]) for most recent results  RADIOLOGY RESULTS: See [facility] (CHL-Epic) for most recent results  IMPRESSION: #1  Pain in the right groin, right flank, and right lateral thigh of undetermined etiology #2  No evidence of inguinal or femoral hernia on physical examination  PLAN: I  discussed the above findings with the patient. I explained to her that there is no evidence of an acute intra-abdominal process to explain her pain. There is also no evidence on physical examination or CT scanning of inguinal, incisional, or femoral hernia.  Given her some history of low back surgery and presumably degenerative disc disease, her symptoms may be coming from further pathology in the lumbosacral spine. Certainly an evaluation of her previous surgical site with MRI scan may be indicated. If there is no sign of significant pathology in the lumbosacral spine, then I would consider referral to orthopedics surgery for assessment of the right hip.  Patient will return for surgical care as needed.  Velora Heckler, MD, FACS General & Endocrine Surgery [facility]  Primary Care Physician: Arlan Organ., MD

## 2013-09-24 NOTE — Progress Notes (Signed)
Message left

## 2013-10-02 NOTE — Progress Notes (Signed)
Telephone call, has followup scheduled with orthopedic surgeon who had done her back surgery. Reports pain has subsided.

## 2013-10-03 ENCOUNTER — Other Ambulatory Visit: Payer: Self-pay | Admitting: Women's Health

## 2013-10-04 ENCOUNTER — Other Ambulatory Visit: Payer: Self-pay | Admitting: Women's Health

## 2013-10-04 MED ORDER — FLUCONAZOLE 150 MG PO TABS: 150.0000 mg | ORAL_TABLET | Freq: Once | ORAL | Status: DC

## 2013-10-08 ENCOUNTER — Other Ambulatory Visit: Payer: Self-pay | Admitting: Women's Health

## 2013-10-08 MED ORDER — TERCONAZOLE 0.8 % VA CREA: 1.0000 | TOPICAL_CREAM | Freq: Every day | VAGINAL | Status: DC

## 2013-10-29 ENCOUNTER — Other Ambulatory Visit: Payer: Self-pay | Admitting: Women's Health

## 2013-10-29 MED ORDER — FLUCONAZOLE 200 MG PO TABS: ORAL_TABLET | ORAL | Status: DC

## 2013-10-29 MED ORDER — FLUCONAZOLE 150 MG PO TABS: 150.0000 mg | ORAL_TABLET | Freq: Once | ORAL | Status: DC

## 2013-11-25 ENCOUNTER — Encounter (INDEPENDENT_AMBULATORY_CARE_PROVIDER_SITE_OTHER): Payer: Self-pay | Admitting: Surgery

## 2013-12-11 ENCOUNTER — Other Ambulatory Visit: Payer: Self-pay | Admitting: Women's Health

## 2013-12-26 ENCOUNTER — Other Ambulatory Visit: Payer: Self-pay | Admitting: Women's Health

## 2014-02-10 ENCOUNTER — Other Ambulatory Visit: Payer: Self-pay | Admitting: Women's Health

## 2014-02-13 ENCOUNTER — Encounter: Payer: Self-pay | Admitting: Women's Health

## 2014-02-13 ENCOUNTER — Ambulatory Visit (INDEPENDENT_AMBULATORY_CARE_PROVIDER_SITE_OTHER): Payer: BLUE CROSS/BLUE SHIELD | Admitting: Women's Health

## 2014-02-13 VITALS — BP 110/80 | Ht 62.0 in | Wt 206.0 lb

## 2014-02-13 DIAGNOSIS — B9689 Other specified bacterial agents as the cause of diseases classified elsewhere: Secondary | ICD-10-CM

## 2014-02-13 DIAGNOSIS — Z79890 Hormone replacement therapy (postmenopausal): Secondary | ICD-10-CM

## 2014-02-13 DIAGNOSIS — N898 Other specified noninflammatory disorders of vagina: Secondary | ICD-10-CM

## 2014-02-13 DIAGNOSIS — B373 Candidiasis of vulva and vagina: Secondary | ICD-10-CM

## 2014-02-13 DIAGNOSIS — A499 Bacterial infection, unspecified: Secondary | ICD-10-CM

## 2014-02-13 DIAGNOSIS — B3731 Acute candidiasis of vulva and vagina: Secondary | ICD-10-CM

## 2014-02-13 DIAGNOSIS — N760 Acute vaginitis: Secondary | ICD-10-CM

## 2014-02-13 LAB — WET PREP FOR TRICH, YEAST, CLUE: Trich, Wet Prep: NONE SEEN

## 2014-02-13 MED ORDER — PROGESTERONE MICRONIZED 200 MG PO CAPS: 200.0000 mg | ORAL_CAPSULE | Freq: Every day | ORAL | Status: DC

## 2014-02-13 MED ORDER — METRONIDAZOLE 0.75 % VA GEL: VAGINAL | Status: DC

## 2014-02-13 MED ORDER — FLUCONAZOLE 200 MG PO TABS: ORAL_TABLET | ORAL | Status: DC

## 2014-02-13 MED ORDER — ESTRADIOL 0.05 MG/24HR TD PTWK: 0.0500 mg | MEDICATED_PATCH | TRANSDERMAL | Status: DC

## 2014-02-13 NOTE — Patient Instructions (Signed)

## 2014-02-13 NOTE — Progress Notes (Signed)
Patient ID: Tammy SkillBarbara J Huber, female   DOB: 06/01/1970, 44 y.o.   MRN: [id number] Presents with complaint of persistent right lower quadrant pain mostly in the morning, no pain by afternoon and evening. Has had a negative CT, negative evaluation for inguinal hernia. History of endometriosis. [facility]orrheic with [facility]. Also reports no libido, poor sleep, increased moodiness. Treated for yeast infection last week, slight discharge with minimal irritation. Denies urinary symptoms, fever, nausea.  Exam: Appears well. External genitalia small asymptomatic sebaceous cyst left labia, speculum exam scant discharge, no odor, minimal erythema noted. Wet prep positive for yeast, clues and TNTC bacteria. Bimanual slight tenderness right adnexa, no CMT.  Persistent right lower quadrant pain/probable endometriosis Bacteria vaginosis and yeast Postmenopausal with symptoms  Plan: Options reviewed, will try estradiol 0.05 patch weekly, Prometrium 200 at bedtime day one through 12, prescription, proper use, risks of blood clots, strokes, breast cancer reviewed. Instructed to call if no relief of symptoms. Diflucan 200 mg by mouth today repeat in 3 days if needed, MetroGel vaginal cream 1 applicator at bedtime 5, alcohol precautions reviewed. Instructed to call if no relief of vaginal irritation. UA pending.

## 2014-02-14 LAB — URINALYSIS W MICROSCOPIC + REFLEX CULTURE
BACTERIA UA: NONE SEEN
Bilirubin Urine: NEGATIVE
CASTS: NONE SEEN
Crystals: NONE SEEN
Glucose, UA: NEGATIVE mg/dL
Ketones, ur: NEGATIVE mg/dL
Leukocytes, UA: NEGATIVE
Nitrite: NEGATIVE
PH: 7 (ref 5.0–8.0)
Protein, ur: NEGATIVE mg/dL
SPECIFIC GRAVITY, URINE: 1.019 (ref 1.005–1.030)
UROBILINOGEN UA: 0.2 mg/dL (ref 0.0–1.0)

## 2014-06-30 ENCOUNTER — Other Ambulatory Visit: Payer: Self-pay | Admitting: Women's Health

## 2014-10-24 ENCOUNTER — Encounter: Payer: BLUE CROSS/BLUE SHIELD | Admitting: Women's Health

## 2015-03-13 ENCOUNTER — Encounter: Payer: BLUE CROSS/BLUE SHIELD | Admitting: Women's Health

## 2015-04-03 ENCOUNTER — Encounter: Payer: BLUE CROSS/BLUE SHIELD | Admitting: Women's Health

## 2015-07-16 ENCOUNTER — Ambulatory Visit (INDEPENDENT_AMBULATORY_CARE_PROVIDER_SITE_OTHER): Payer: BLUE CROSS/BLUE SHIELD | Admitting: Women's Health

## 2015-07-16 ENCOUNTER — Encounter: Payer: Self-pay | Admitting: Women's Health

## 2015-07-16 VITALS — BP 112/76

## 2015-07-16 DIAGNOSIS — F4322 Adjustment disorder with anxiety: Secondary | ICD-10-CM

## 2015-07-16 DIAGNOSIS — Z13220 Encounter for screening for lipoid disorders: Secondary | ICD-10-CM

## 2015-07-16 DIAGNOSIS — B3731 Acute candidiasis of vulva and vagina: Secondary | ICD-10-CM

## 2015-07-16 DIAGNOSIS — R5383 Other fatigue: Secondary | ICD-10-CM

## 2015-07-16 DIAGNOSIS — Z1329 Encounter for screening for other suspected endocrine disorder: Secondary | ICD-10-CM | POA: Diagnosis not present

## 2015-07-16 DIAGNOSIS — B373 Candidiasis of vulva and vagina: Secondary | ICD-10-CM

## 2015-07-16 DIAGNOSIS — Z79890 Hormone replacement therapy (postmenopausal): Secondary | ICD-10-CM | POA: Diagnosis not present

## 2015-07-16 LAB — CBC WITH DIFFERENTIAL/PLATELET
BASOS ABS: 0 {cells}/uL (ref 0–200)
Basophils Relative: 0 %
EOS ABS: 240 {cells}/uL (ref 15–500)
EOS PCT: 2 %
HCT: 45.8 % — ABNORMAL HIGH (ref 35.0–45.0)
Hemoglobin: 15.1 g/dL (ref 11.7–15.5)
LYMPHS PCT: 29 %
Lymphs Abs: 3480 cells/uL (ref 850–3900)
MCH: 28.7 pg (ref 27.0–33.0)
MCHC: 33 g/dL (ref 32.0–36.0)
MCV: 87.1 fL (ref 80.0–100.0)
MONOS PCT: 4 %
MPV: 9.8 fL (ref 7.5–12.5)
Monocytes Absolute: 480 cells/uL (ref 200–950)
Neutro Abs: 7800 cells/uL (ref 1500–7800)
Neutrophils Relative %: 65 %
PLATELETS: 264 10*3/uL (ref 140–400)
RBC: 5.26 MIL/uL — ABNORMAL HIGH (ref 3.80–5.10)
RDW: 13.9 % (ref 11.0–15.0)
WBC: 12 10*3/uL — ABNORMAL HIGH (ref 3.8–10.8)

## 2015-07-16 LAB — COMPREHENSIVE METABOLIC PANEL
ALT: 28 U/L (ref 6–29)
AST: 22 U/L (ref 10–30)
Albumin: 4.4 g/dL (ref 3.6–5.1)
Alkaline Phosphatase: 73 U/L (ref 33–115)
BUN: 12 mg/dL (ref 7–25)
CHLORIDE: 102 mmol/L (ref 98–110)
CO2: 22 mmol/L (ref 20–31)
CREATININE: 0.69 mg/dL (ref 0.50–1.10)
Calcium: 9.3 mg/dL (ref 8.6–10.2)
GLUCOSE: 82 mg/dL (ref 65–99)
POTASSIUM: 4.9 mmol/L (ref 3.5–5.3)
SODIUM: 136 mmol/L (ref 135–146)
Total Bilirubin: 0.3 mg/dL (ref 0.2–1.2)
Total Protein: 6.9 g/dL (ref 6.1–8.1)

## 2015-07-16 LAB — LIPID PANEL
CHOL/HDL RATIO: 3 ratio (ref <=5.0)
CHOLESTEROL: 153 mg/dL (ref 125–200)
HDL: 51 mg/dL (ref >=46)
LDL Cholesterol: 82 mg/dL (ref <130)
Triglycerides: 99 mg/dL (ref <150)
VLDL: 20 mg/dL (ref <30)

## 2015-07-16 LAB — TSH: TSH: 1.92 mIU/L

## 2015-07-16 LAB — WET PREP FOR TRICH, YEAST, CLUE
CLUE CELLS WET PREP: NONE SEEN
TRICH WET PREP: NONE SEEN

## 2015-07-16 MED ORDER — PROGESTERONE MICRONIZED 200 MG PO CAPS: 200.0000 mg | ORAL_CAPSULE | Freq: Every day | ORAL | Status: DC

## 2015-07-16 MED ORDER — FLUCONAZOLE 200 MG PO TABS: ORAL_TABLET | ORAL | Status: DC

## 2015-07-16 MED ORDER — ESTRADIOL 0.05 MG/24HR TD PTWK: 0.0500 mg | MEDICATED_PATCH | TRANSDERMAL | Status: DC

## 2015-07-16 NOTE — Progress Notes (Signed)
Patient ID: Tammy Huber, female   DOB: 09/11/1970, 45 y.o.   MRN: [id number] Presents with several concerns. Has had increased hot flushes had good relief when using estradiol patch and Prometrium, ran out of prescription annual is due- scheduled July 21. Has had increased vaginal discharge with some itching used over-the-counter Monistat with some relief. Denies urinary symptoms, abdominal pain or fever. Increased fatigue, requesting labs prior to annual exam. Postmenopausal with no bleeding. Has not had a mammogram. 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  Exam: Appears well. External genitalia mild erythema at introitus, speculum exam moderate amount of white discharge noted wet prep positive for yeast. Bimanual no CMT or adnexal tenderness.  Yeast vaginitis Menopausal symptoms Fatigue/situational stress  Plan: Diflucan 200 by mouth repeat in 3 days if needed. Prescription proper use given. [place]  0.05 patch weekly prescription, proper use given and reviewed one month supply given, Prometrium 200 mg at bedtime a one through 12 of each month. Keep scheduled annual exam office visit. Encourage counseling, delegating work and home responsibilities. Aware of need to quit smoking, tips reviewed. Will check CBC, lipid panel, TSH, CMP, vitamin D, TSH, will discuss labs at annual exam. Breast center information given instructed to schedule.

## 2015-07-16 NOTE — Patient Instructions (Signed)
Smoking Cessation, Tips for Success If you are ready to quit smoking, congratulations! You have chosen to help yourself be healthier. Cigarettes bring nicotine, tar, carbon monoxide, and other irritants into your body. Your lungs, heart, and blood vessels will be able to work better without these poisons. There are many different ways to quit smoking. Nicotine gum, nicotine patches, a nicotine inhaler, or nicotine nasal spray can help with physical craving. Hypnosis, support groups, and medicines help break the habit of smoking. WHAT THINGS CAN I DO TO MAKE QUITTING EASIER?  Here are some tips to help you quit for good:  Pick a date when you will quit smoking completely. Tell all of your friends and family about your plan to quit on that date.  Do not try to slowly cut down on the number of cigarettes you are smoking. Pick a quit date and quit smoking completely starting on that day.  Throw away all cigarettes.   Clean and remove all ashtrays from your home, work, and car.  On a card, write down your reasons for quitting. Carry the card with you and read it when you get the urge to smoke.  Cleanse your body of nicotine. Drink enough water and fluids to keep your urine clear or pale yellow. Do this after quitting to flush the nicotine from your body.  Learn to predict your moods. Do not let a bad situation be your excuse to have a cigarette. Some situations in your life might tempt you into wanting a cigarette.  Never have "just one" cigarette. It leads to wanting another and another. Remind yourself of your decision to quit.  Change habits associated with smoking. If you smoked while driving or when feeling stressed, try other activities to replace smoking. Stand up when drinking your coffee. Brush your teeth after eating. Sit in a different chair when you read the paper. Avoid alcohol while trying to quit, and try to drink fewer caffeinated beverages. Alcohol and caffeine may urge you to  smoke.  Avoid foods and drinks that can trigger a desire to smoke, such as sugary or spicy foods and alcohol.  Ask people who smoke not to smoke around you.  Have something planned to do right after eating or having a cup of coffee. For example, plan to take a walk or exercise.  Try a relaxation exercise to calm you down and decrease your stress. Remember, you may be tense and nervous for the first 2 weeks after you quit, but this will pass.  Find new activities to keep your hands busy. Play with a pen, coin, or rubber band. Doodle or draw things on paper.  Brush your teeth right after eating. This will help cut down on the craving for the taste of tobacco after meals. You can also try mouthwash.   Use oral substitutes in place of cigarettes. Try using lemon drops, carrots, cinnamon sticks, or chewing gum. Keep them handy so they are available when you have the urge to smoke.  When you have the urge to smoke, try deep breathing.  Designate your home as a nonsmoking area.  If you are a heavy smoker, ask your health care provider about a prescription for nicotine chewing gum. It can ease your withdrawal from nicotine.  Reward yourself. Set aside the cigarette money you save and buy yourself something nice.  Look for support from others. Join a support group or smoking cessation program. Ask someone at home or at work to help you with your plan   to quit smoking.  Always ask yourself, "Do I need this cigarette or is this just a reflex?" Tell yourself, "Today, I choose not to smoke," or "I do not want to smoke." You are reminding yourself of your decision to quit.  Do not replace cigarette smoking with electronic cigarettes (commonly called e-cigarettes). The safety of e-cigarettes is unknown, and some may contain harmful chemicals.  If you relapse, do not give up! Plan ahead and think about what you will do the next time you get the urge to smoke. HOW WILL I FEEL WHEN I QUIT SMOKING? You  may have symptoms of withdrawal because your body is used to nicotine (the addictive substance in cigarettes). You may crave cigarettes, be irritable, feel very hungry, cough often, get headaches, or have difficulty concentrating. The withdrawal symptoms are only temporary. They are strongest when you first quit but will go away within 10-14 days. When withdrawal symptoms occur, stay in control. Think about your reasons for quitting. Remind yourself that these are signs that your body is healing and getting used to being without cigarettes. Remember that withdrawal symptoms are easier to treat than the major diseases that smoking can cause.  Even after the withdrawal is over, expect periodic urges to smoke. However, these cravings are generally short lived and will go away whether you smoke or not. Do not smoke! WHAT RESOURCES ARE AVAILABLE TO HELP ME QUIT SMOKING? Your health care provider can direct you to community resources or hospitals for support, which may include:  Group support.  Education.  Hypnosis.  Therapy.   This information is not intended to replace advice given to you by your health care provider. Make sure you discuss any questions you have with your health care provider.   Document Released: 10/09/2003 Document Revised: 01/31/2014 Document Reviewed: 06/28/2012 Elsevier Interactive Patient Education 2016 Elsevier Inc. Monilial Vaginitis Vaginitis in a soreness, swelling and redness (inflammation) of the vagina and vulva. Monilial vaginitis is not a sexually transmitted infection. CAUSES  Yeast vaginitis is caused by yeast (candida) that is normally found in your vagina. With a yeast infection, the candida has overgrown in number to a point that upsets the chemical balance. SYMPTOMS   White, thick vaginal discharge.  Swelling, itching, redness and irritation of the vagina and possibly the lips of the vagina (vulva).  Burning or painful urination.  Painful  intercourse. DIAGNOSIS  Things that may contribute to monilial vaginitis are:  Postmenopausal and virginal states.  Pregnancy.  Infections.  Being tired, sick or stressed, especially if you had monilial vaginitis in the past.  Diabetes. Good control will help lower the chance.  Birth control pills.  Tight fitting garments.  Using bubble bath, feminine sprays, douches or deodorant tampons.  Taking certain medications that kill germs (antibiotics).  Sporadic recurrence can occur if you become ill. TREATMENT  Your caregiver will give you medication.  There are several kinds of anti monilial vaginal creams and suppositories specific for monilial vaginitis. For recurrent yeast infections, use a suppository or cream in the vagina 2 times a week, or as directed.  Anti-monilial or steroid cream for the itching or irritation of the vulva may also be used. Get your caregiver's permission.  Painting the vagina with methylene blue solution may help if the monilial cream does not work.  Eating yogurt may help prevent monilial vaginitis. HOME CARE INSTRUCTIONS   Finish all medication as prescribed.  Do not have sex until treatment is completed or after your caregiver tells you  it is okay.  Take warm sitz baths.  Do not douche.  Do not use tampons, especially scented ones.  Wear cotton underwear.  Avoid tight pants and panty hose.  Tell your sexual partner that you have a yeast infection. They should go to their caregiver if they have symptoms such as mild rash or itching.  Your sexual partner should be treated as well if your infection is difficult to eliminate.  Practice safer sex. Use condoms.  Some vaginal medications cause latex condoms to fail. Vaginal medications that harm condoms are:  Cleocin cream.  Butoconazole (Femstat).  Terconazole (Terazol) vaginal suppository.  Miconazole (Monistat) (may be purchased over the counter). SEEK MEDICAL CARE IF:   You  have a temperature by mouth above 102 F (38.9 C).  The infection is getting worse after 2 days of treatment.  The infection is not getting better after 3 days of treatment.  You develop blisters in or around your vagina.  You develop vaginal bleeding, and it is not your menstrual period.  You have pain when you urinate.  You develop intestinal problems.  You have pain with sexual intercourse.   This information is not intended to replace advice given to you by your health care provider. Make sure you discuss any questions you have with your health care provider.   Document Released: 10/20/2004 Document Revised: 04/04/2011 Document Reviewed: 07/14/2014 Elsevier Interactive Patient Education [employer]2016 Elsevier Inc.

## 2015-07-17 LAB — VITAMIN D 25 HYDROXY (VIT D DEFICIENCY, FRACTURES): VIT D 25 HYDROXY: 35 ng/mL (ref 30–100)

## 2015-08-06 ENCOUNTER — Encounter: Payer: BLUE CROSS/BLUE SHIELD | Admitting: Women's Health

## 2015-08-06 DIAGNOSIS — Z0289 Encounter for other administrative examinations: Secondary | ICD-10-CM

## 2015-08-14 ENCOUNTER — Encounter: Payer: BLUE CROSS/BLUE SHIELD | Admitting: Women's Health

## 2015-08-18 ENCOUNTER — Other Ambulatory Visit: Payer: Self-pay | Admitting: Women's Health

## 2015-08-18 DIAGNOSIS — Z79890 Hormone replacement therapy (postmenopausal): Secondary | ICD-10-CM

## 2015-12-31 ENCOUNTER — Encounter: Payer: BLUE CROSS/BLUE SHIELD | Admitting: Women's Health

## 2015-12-31 DIAGNOSIS — Z0289 Encounter for other administrative examinations: Secondary | ICD-10-CM

## 2016-03-09 ENCOUNTER — Ambulatory Visit (INDEPENDENT_AMBULATORY_CARE_PROVIDER_SITE_OTHER): Payer: BLUE CROSS/BLUE SHIELD | Admitting: Women's Health

## 2016-03-09 ENCOUNTER — Encounter: Payer: Self-pay | Admitting: Women's Health

## 2016-03-09 VITALS — BP 114/70 | Ht 62.0 in | Wt 210.0 lb

## 2016-03-09 DIAGNOSIS — Z13220 Encounter for screening for lipoid disorders: Secondary | ICD-10-CM

## 2016-03-09 DIAGNOSIS — Z79890 Hormone replacement therapy (postmenopausal): Secondary | ICD-10-CM

## 2016-03-09 DIAGNOSIS — B373 Candidiasis of vulva and vagina: Secondary | ICD-10-CM

## 2016-03-09 DIAGNOSIS — Z01419 Encounter for gynecological examination (general) (routine) without abnormal findings: Secondary | ICD-10-CM | POA: Diagnosis not present

## 2016-03-09 DIAGNOSIS — Z1151 Encounter for screening for human papillomavirus (HPV): Secondary | ICD-10-CM | POA: Diagnosis not present

## 2016-03-09 DIAGNOSIS — B3731 Acute candidiasis of vulva and vagina: Secondary | ICD-10-CM

## 2016-03-09 LAB — LIPID PANEL
Cholesterol: 140 mg/dL (ref <200)
HDL: 44 mg/dL — ABNORMAL LOW (ref >50)
LDL CALC: 86 mg/dL (ref <100)
Total CHOL/HDL Ratio: 3.2 Ratio (ref <5.0)
Triglycerides: 52 mg/dL (ref <150)
VLDL: 10 mg/dL (ref <30)

## 2016-03-09 LAB — COMPREHENSIVE METABOLIC PANEL
ALT: 21 U/L (ref 6–29)
AST: 17 U/L (ref 10–30)
Albumin: 4.2 g/dL (ref 3.6–5.1)
Alkaline Phosphatase: 58 U/L (ref 33–115)
BUN: 14 mg/dL (ref 7–25)
CHLORIDE: 106 mmol/L (ref 98–110)
CO2: 24 mmol/L (ref 20–31)
Calcium: 9.1 mg/dL (ref 8.6–10.2)
Creat: 0.73 mg/dL (ref 0.50–1.10)
Glucose, Bld: 59 mg/dL — ABNORMAL LOW (ref 65–99)
POTASSIUM: 4.5 mmol/L (ref 3.5–5.3)
Sodium: 140 mmol/L (ref 135–146)
TOTAL PROTEIN: 6.4 g/dL (ref 6.1–8.1)
Total Bilirubin: 0.3 mg/dL (ref 0.2–1.2)

## 2016-03-09 LAB — CBC WITH DIFFERENTIAL/PLATELET
BASOS ABS: 95 {cells}/uL (ref 0–200)
Basophils Relative: 1 %
EOS ABS: 190 {cells}/uL (ref 15–500)
Eosinophils Relative: 2 %
HCT: 44.6 % (ref 35.0–45.0)
Hemoglobin: 14.9 g/dL (ref 11.7–15.5)
Lymphocytes Relative: 31 %
Lymphs Abs: 2945 cells/uL (ref 850–3900)
MCH: 29.4 pg (ref 27.0–33.0)
MCHC: 33.4 g/dL (ref 32.0–36.0)
MCV: 88.1 fL (ref 80.0–100.0)
MONOS PCT: 5 %
MPV: 9.7 fL (ref 7.5–12.5)
Monocytes Absolute: 475 cells/uL (ref 200–950)
NEUTROS PCT: 61 %
Neutro Abs: 5795 cells/uL (ref 1500–7800)
PLATELETS: 272 10*3/uL (ref 140–400)
RBC: 5.06 MIL/uL (ref 3.80–5.10)
RDW: 14.3 % (ref 11.0–15.0)
WBC: 9.5 10*3/uL (ref 3.8–10.8)

## 2016-03-09 MED ORDER — PROGESTERONE MICRONIZED 200 MG PO CAPS: 200.0000 mg | ORAL_CAPSULE | Freq: Every day | ORAL | 4 refills | Status: DC

## 2016-03-09 MED ORDER — ESTRADIOL 0.5 MG PO TABS: 0.5000 mg | ORAL_TABLET | Freq: Every day | ORAL | 4 refills | Status: DC

## 2016-03-09 MED ORDER — FLUCONAZOLE 200 MG PO TABS: ORAL_TABLET | ORAL | 1 refills | Status: DC

## 2016-03-09 NOTE — Addendum Note (Signed)
Addended by: Berna SpareASTILLO, Mechel Schutter A on: 03/09/2016 03:09 PM   Modules accepted: Orders

## 2016-03-09 NOTE — Addendum Note (Signed)
Addended by: Berna SpareASTILLO, Venisha Boehning A on: 03/09/2016 03:11 PM   Modules accepted: Orders

## 2016-03-09 NOTE — Patient Instructions (Signed)
Tammy Huber  [phone] [facility] [phone]  Health Maintenance for Postmenopausal Women Introduction Menopause is a normal process in which your reproductive ability comes to an end. This process happens gradually over a span of months to years, usually between the ages of 32 and 69. Menopause is complete when you have missed 12 consecutive menstrual periods. It is important to talk with your health care provider about some of the most common conditions that affect postmenopausal women, such as heart disease, cancer, and bone loss (osteoporosis). Adopting a healthy lifestyle and getting preventive care can help to promote your health and wellness. Those actions can also lower your chances of developing some of these common conditions. What should I know about menopause? During menopause, you may experience a number of symptoms, such as:  Moderate-to-severe hot flashes.  Night sweats.  Decrease in sex drive.  Mood swings.  Headaches.  Tiredness.  Irritability.  Memory problems.  Insomnia. Choosing to treat or not to treat menopausal changes is an individual decision that you make with your health care provider. What should I know about hormone replacement therapy and supplements? Hormone therapy products are effective for treating symptoms that are associated with menopause, such as hot flashes and night sweats. Hormone replacement carries certain risks, especially as you become older. If you are thinking about using estrogen or estrogen with progestin treatments, discuss the benefits and risks with your health care provider. What should I know about heart disease and stroke? Heart disease, heart attack, and stroke become more likely as you age. This may be due, in part, to the hormonal changes that your body experiences during menopause. These can affect how your body processes dietary fats, triglycerides, and cholesterol. Heart attack and stroke are both medical emergencies. There  are many things that you can do to help prevent heart disease and stroke:  Have your blood pressure checked at least every 1-2 years. High blood pressure causes heart disease and increases the risk of stroke.  If you are 57-56 years old, ask your health care provider if you should take aspirin to prevent a heart attack or a stroke.  Do not use any tobacco products, including cigarettes, chewing tobacco, or electronic cigarettes. If you need help quitting, ask your health care provider.  It is important to eat a healthy diet and maintain a healthy weight.  Be sure to include plenty of vegetables, fruits, low-fat dairy products, and lean protein.  Avoid eating foods that are high in solid fats, added sugars, or salt (sodium).  Get regular exercise. This is one of the most important things that you can do for your health.  Try to exercise for at least 150 minutes each week. The type of exercise that you do should increase your heart rate and make you sweat. This is known as moderate-intensity exercise.  Try to do strengthening exercises at least twice each week. Do these in addition to the moderate-intensity exercise.  Know your numbers.Ask your health care provider to check your cholesterol and your blood glucose. Continue to have your blood tested as directed by your health care provider. What should I know about cancer screening? There are several types of cancer. Take the following steps to reduce your risk and to catch any cancer development as early as possible. Breast Cancer  Practice breast self-awareness.  This means understanding how your breasts normally appear and feel.  It also means doing regular breast self-exams. Let your health care provider know about any changes, no  matter how small.  If you are 73 or older, have a clinician do a breast exam (clinical breast exam or CBE) every year. Depending on your age, family history, and medical history, it may be recommended that  you also have a yearly breast X-ray (mammogram).  If you have a family history of breast cancer, talk with your health care provider about genetic screening.  If you are at high risk for breast cancer, talk with your health care provider about having an MRI and a mammogram every year.  Breast cancer (BRCA) gene test is recommended for women who have family members with BRCA-related cancers. Results of the assessment will determine the need for genetic counseling and BRCA1 and for BRCA2 testing. BRCA-related cancers include these types:  Breast. This occurs in males or females.  Ovarian.  Tubal. This may also be called fallopian tube cancer.  Cancer of the abdominal or pelvic lining (peritoneal cancer).  Prostate.  Pancreatic. Cervical, Uterine, and Ovarian Cancer  Your health care provider may recommend that you be screened regularly for cancer of the pelvic organs. These include your ovaries, uterus, and vagina. This screening involves a pelvic exam, which includes checking for microscopic changes to the surface of your cervix (Pap test).  For women ages 21-65, health care providers may recommend a pelvic exam and a Pap test every three years. For women ages 76-65, they may recommend the Pap test and pelvic exam, combined with testing for human papilloma virus (HPV), every five years. Some types of HPV increase your risk of cervical cancer. Testing for HPV may also be done on women of any age who have unclear Pap test results.  Other health care providers may not recommend any screening for nonpregnant women who are considered low risk for pelvic cancer and have no symptoms. Ask your health care provider if a screening pelvic exam is right for you.  If you have had past treatment for cervical cancer or a condition that could lead to cancer, you need Pap tests and screening for cancer for at least 20 years after your treatment. If Pap tests have been discontinued for you, your risk factors  (such as having a new sexual partner) need to be reassessed to determine if you should start having screenings again. Some women have medical problems that increase the chance of getting cervical cancer. In these cases, your health care provider may recommend that you have screening and Pap tests more often.  If you have a family history of uterine cancer or ovarian cancer, talk with your health care provider about genetic screening.  If you have vaginal bleeding after reaching menopause, tell your health care provider.  There are currently no reliable tests available to screen for ovarian cancer. Lung Cancer  Lung cancer screening is recommended for adults 75-50 years old who are at high risk for lung cancer because of a history of smoking. A yearly low-dose CT scan of the lungs is recommended if you:  Currently smoke.  Have a history of at least 30 pack-years of smoking and you currently smoke or have quit within the past 15 years. A pack-year is smoking an average of one pack of cigarettes per day for one year. Yearly screening should:  Continue until it has been 15 years since you quit.  Stop if you develop a health problem that would prevent you from having lung cancer treatment. Colorectal Cancer  This type of cancer can be detected and can often be prevented.  Routine  colorectal cancer screening usually begins at age 85 and continues through age 68.  If you have risk factors for colon cancer, your health care provider may recommend that you be screened at an earlier age.  If you have a family history of colorectal cancer, talk with your health care provider about genetic screening.  Your health care provider may also recommend using home test kits to check for hidden blood in your stool.  A small camera at the end of a tube can be used to examine your colon directly (sigmoidoscopy or colonoscopy). This is done to check for the earliest forms of colorectal cancer.  Direct  examination of the colon should be repeated every 5-10 years until age 75. However, if early forms of precancerous polyps or small growths are found or if you have a family history or genetic risk for colorectal cancer, you may need to be screened more often. Skin Cancer  Check your skin from head to toe regularly.  Monitor any moles. Be sure to tell your health care provider:  About any new moles or changes in moles, especially if there is a change in a mole's shape or color.  If you have a mole that is larger than the size of a pencil eraser.  If any of your family members has a history of skin cancer, especially at a young age, talk with your health care provider about genetic screening.  Always use sunscreen. Apply sunscreen liberally and repeatedly throughout the day.  Whenever you are outside, protect yourself by wearing long sleeves, pants, a wide-brimmed hat, and sunglasses. What should I know about osteoporosis? Osteoporosis is a condition in which bone destruction happens more quickly than new bone creation. After menopause, you may be at an increased risk for osteoporosis. To help prevent osteoporosis or the bone fractures that can happen because of osteoporosis, the following is recommended:  If you are 88-21 years old, get at least 1,000 mg of calcium and at least 600 mg of vitamin D per day.  If you are older than age 24 but younger than age 38, get at least 1,200 mg of calcium and at least 600 mg of vitamin D per day.  If you are older than age 59, get at least 1,200 mg of calcium and at least 800 mg of vitamin D per day. Smoking and excessive alcohol intake increase the risk of osteoporosis. Eat foods that are rich in calcium and vitamin D, and do weight-bearing exercises several times each week as directed by your health care provider. What should I know about how menopause affects my mental health? Depression may occur at any age, but it is more common as you become older.  Common symptoms of depression include:  Low or sad mood.  Changes in sleep patterns.  Changes in appetite or eating patterns.  Feeling an overall lack of motivation or enjoyment of activities that you previously enjoyed.  Frequent crying spells. Talk with your health care provider if you think that you are experiencing depression. What should I know about immunizations? It is important that you get and maintain your immunizations. These include:  Tetanus, diphtheria, and pertussis (Tdap) booster vaccine.  Influenza every year before the flu season begins.  Pneumonia vaccine.  Shingles vaccine. Your health care provider may also recommend other immunizations. This information is not intended to replace advice given to you by your health care provider. Make sure you discuss any questions you have with your health care provider. Document Released:  Document Revised: 07/31/2015 Document Reviewed: 10/14/2014  2017 Elsevier  

## 2016-03-09 NOTE — Progress Notes (Signed)
Tammy SkillBarbara J Huber 02/26/1970 [id number]    History:    Presents for annual exam.  Postmenopausal on estradiol patch and cyclic Prometrium with one episode of bleeding this past year.  Has not had a mammogram. Normal Pap history. Has had long-term history of pelvic pain from adhesions and possible endometriosis. Pelvic pain has been decreased this past year.  Past medical history, past surgical history, family history and social history were all reviewed and documented in the EPIC chart. Owns a business. Has a 716 yo granddaughter that she is very involved with. 45 year old son causing some issues.  ROS:  A ROS was performed and pertinent positives and negatives are included.  Exam:  Vitals:   03/09/16 1207  BP: 114/70  Weight: 210 lb (95.3 kg)  Height: 5\' 2"  (1.575 m)   Body mass index is 38.41 kg/m.   General appearance:  Normal Thyroid:  Symmetrical, normal in size, without palpable masses or nodularity. Respiratory  Auscultation:  Clear without wheezing or rhonchi Cardiovascular  Auscultation:  Regular rate, without rubs, murmurs or gallops  Edema/varicosities:  Not grossly evident Abdominal  Soft,nontender, without masses, guarding or rebound.  Liver/spleen:  No organomegaly noted  Hernia:  None appreciated  Skin  Inspection:  Grossly normal   Breasts: Examined lying and sitting.     Right: Without masses, retractions, discharge or axillary adenopathy.     Left: Without masses, retractions, discharge or axillary adenopathy. Gentitourinary   Inguinal/mons:  Normal without inguinal adenopathy  External genitalia:  Normal  BUS/Urethra/Skene's glands:  Normal  Vagina:  Normal  Cervix:  Normal  Uterus:   normal in size, shape and contour.  Midline and mobile  Adnexa/parametria:     Rt: Without masses or tenderness.   Lt: Without masses or tenderness.  Anus and perineum: Normal  Digital rectal exam: Normal sphincter tone without palpated masses or  tenderness  Assessment/Plan:  46 y.o. M WF G2 P2 for annual exam with otter relief of hot flushes with HRT.   Bipolar-situational stress/family issues  Postmenopausal on HRT with one episode of bleeding this past year  Obesity  Plan:  Currently on no medications for depression, Tammy FanningJulie Huber's name and number given instructed to schedule counseling. HRT reviewed will continue with Prometrium 200 mg at bedtime day 1 through 12 of each month, will try estradiol 0.5 by mouth daily prescription, proper use given. Risks of blood clots, strokes and breast cancer reviewed. SBE's, reviewed importance of annual screening mammogram, instructed to schedule, breast center information given. Increase exercise and decrease calories for weight loss encouraged. Vitamin D 2000 daily. CBC, CMP, lipid panel, Pap with HR HPV typing, new screening guidelines reviewed. Refill of Diflucan 200 mg for occasional itching per request.  Harrington ChallengerYOUNG,Tammy J WHNP, 1:04 PM 03/09/2016

## 2016-03-10 ENCOUNTER — Encounter: Payer: Self-pay | Admitting: Women's Health

## 2016-03-11 LAB — PAP, TP IMAGING W/ HPV RNA, RFLX HPV TYPE 16,18/45: HPV mRNA, High Risk: NOT DETECTED

## 2016-06-08 ENCOUNTER — Encounter: Payer: Self-pay | Admitting: Gynecology

## 2016-07-04 ENCOUNTER — Ambulatory Visit (INDEPENDENT_AMBULATORY_CARE_PROVIDER_SITE_OTHER): Payer: BLUE CROSS/BLUE SHIELD

## 2016-07-04 ENCOUNTER — Ambulatory Visit (INDEPENDENT_AMBULATORY_CARE_PROVIDER_SITE_OTHER): Payer: BLUE CROSS/BLUE SHIELD | Admitting: Podiatry

## 2016-07-04 DIAGNOSIS — M659 Synovitis and tenosynovitis, unspecified: Secondary | ICD-10-CM

## 2016-07-04 DIAGNOSIS — M79671 Pain in right foot: Secondary | ICD-10-CM | POA: Diagnosis not present

## 2016-07-04 DIAGNOSIS — M79672 Pain in left foot: Secondary | ICD-10-CM

## 2016-07-04 DIAGNOSIS — M65979 Unspecified synovitis and tenosynovitis, unspecified ankle and foot: Secondary | ICD-10-CM

## 2016-07-04 DIAGNOSIS — F3181 Bipolar II disorder: Secondary | ICD-10-CM | POA: Insufficient documentation

## 2016-07-04 DIAGNOSIS — M76829 Posterior tibial tendinitis, unspecified leg: Secondary | ICD-10-CM | POA: Diagnosis not present

## 2016-07-04 DIAGNOSIS — K219 Gastro-esophageal reflux disease without esophagitis: Secondary | ICD-10-CM | POA: Insufficient documentation

## 2016-07-04 MED ORDER — METHYLPREDNISOLONE 4 MG PO TBPK: ORAL_TABLET | ORAL | 0 refills | Status: DC

## 2016-07-04 MED ORDER — DICLOFENAC SODIUM 75 MG PO TBEC: 75.0000 mg | DELAYED_RELEASE_TABLET | Freq: Two times a day (BID) | ORAL | 0 refills | Status: DC

## 2016-07-13 NOTE — Progress Notes (Signed)
   Subjective:  Patient presents today for sharp, aching pain and tenderness to the lateral aspect of bilateral ankles that has been present for the past 8 months. She reports associated swelling that has been worsening over the last month. She reports bilateral foot pain along the medial aspects as well. She reports h/o bunionectomy. She is here for further evaluation and treatment.  Objective / Physical Exam:  General:  The patient is alert and oriented x3 in no acute distress. Dermatology:  Skin is warm, dry and supple bilateral lower extremities. Negative for open lesions or macerations. Vascular:  Palpable pedal pulses bilaterally. No edema or erythema noted. Capillary refill within normal limits. Neurological:  Epicritic and protective threshold grossly intact bilaterally.  Musculoskeletal Exam:  Pain on palpation to the lateral aspects of the patient's right ankle. Mild edema noted. Pain with palpation to the insertions of PT tendons bilaterally. Range of motion within normal limits to all pedal and ankle joints bilateral. Muscle strength 5/5 in all groups bilateral.   Radiographic Exam:  Normal osseous mineralization. Joint spaces preserved. No fracture/dislocation/boney destruction.    Assessment: #1 pain in lateral right ankle #2 synovitis of right ankle #3 PT tendinitis bilaterally  Plan of Care:  #1 Patient was evaluated. X-rays reviewed. #2 injection of 0.5 mL Celestone Soluspan injected in the patient's right ankle. #3 Injection of 0.5 mLs Celestone Soluspan injected into the insertion of bilateral PT tendons. Care was taken to avoid direct injection into the tendons. #4 prescription for Medrol Dosepak given to patient. #5 prescription for diclofenac 75 mg #60 twice a day given to patient. #6 recommended good shoe gear. #7 return to clinic in 4 weeks.   Felecia ShellingBrent M. Evans, DPM [facility]  Dr. Felecia ShellingBrent M. Evans, DPM    [address]                                         [place], [place] [address]                Office [phone]  Fax [phone]

## 2016-07-18 MED ORDER — BETAMETHASONE SOD PHOS & ACET 6 (3-3) MG/ML IJ SUSP: 3.0000 mg | Freq: Once | INTRAMUSCULAR | Status: DC

## 2016-08-08 ENCOUNTER — Ambulatory Visit: Payer: BLUE CROSS/BLUE SHIELD | Admitting: Podiatry

## 2016-08-15 ENCOUNTER — Ambulatory Visit: Payer: BLUE CROSS/BLUE SHIELD | Admitting: Podiatry

## 2016-08-29 ENCOUNTER — Ambulatory Visit: Payer: BLUE CROSS/BLUE SHIELD | Admitting: Podiatry

## 2016-10-19 ENCOUNTER — Ambulatory Visit (INDEPENDENT_AMBULATORY_CARE_PROVIDER_SITE_OTHER): Payer: BLUE CROSS/BLUE SHIELD | Admitting: Women's Health

## 2016-10-19 ENCOUNTER — Encounter: Payer: Self-pay | Admitting: Women's Health

## 2016-10-19 VITALS — BP 118/80 | Ht 62.0 in | Wt 227.0 lb

## 2016-10-19 DIAGNOSIS — N898 Other specified noninflammatory disorders of vagina: Secondary | ICD-10-CM

## 2016-10-19 DIAGNOSIS — N760 Acute vaginitis: Secondary | ICD-10-CM | POA: Diagnosis not present

## 2016-10-19 DIAGNOSIS — B9689 Other specified bacterial agents as the cause of diseases classified elsewhere: Secondary | ICD-10-CM

## 2016-10-19 DIAGNOSIS — R300 Dysuria: Secondary | ICD-10-CM

## 2016-10-19 LAB — WET PREP FOR TRICH, YEAST, CLUE

## 2016-10-19 MED ORDER — METRONIDAZOLE 0.75 % VA GEL: VAGINAL | 0 refills | Status: DC

## 2016-10-19 MED ORDER — PROGESTERONE MICRONIZED 100 MG PO CAPS: 100.0000 mg | ORAL_CAPSULE | Freq: Every day | ORAL | 3 refills | Status: DC

## 2016-10-19 NOTE — Patient Instructions (Signed)

## 2016-10-19 NOTE — Addendum Note (Signed)
Addended by: Kem Parkinson on: 10/19/2016 10:29 AM   Modules accepted: Orders

## 2016-10-19 NOTE — Progress Notes (Signed)
Presents with complaint of vaginal discharge with odor and l burning sensation for the past week. Mild urinary frequency, no pain at end of stream of urination. Postmenopausal on  HRT with no bleeding. History of long-term pelvic pain. Denies urinary symptoms of pain or burning, abdominal pain or fever. obese  Exam: Appears well. External genitalia mild erythema at introitus, speculum exam scant white discharge, wet prep positive for clues, TNTC bacteria. UA: +1 blood, negative leukocytes, 0-5 WBCs, 10-20 rbc's,10- 20 squamous epithelials, moderate bacteria  Bacteria vaginosis  Plan: MetroGel vaginal cream 1 applicator at bedtime 5, alcohol precautions reviewed, instructed to call if no relief of symptoms. Urine culture pending.

## 2016-10-20 LAB — URINALYSIS W MICROSCOPIC + REFLEX CULTURE
Bilirubin Urine: NEGATIVE
Glucose, UA: NEGATIVE
HYALINE CAST: NONE SEEN /LPF
Ketones, ur: NEGATIVE
Leukocyte Esterase: NEGATIVE
Nitrites, Initial: NEGATIVE
Protein, ur: NEGATIVE
SPECIFIC GRAVITY, URINE: 1.025 (ref 1.001–1.03)
pH: 6 (ref 5.0–8.0)

## 2016-10-20 LAB — NO CULTURE INDICATED

## 2017-01-20 ENCOUNTER — Other Ambulatory Visit: Payer: Self-pay | Admitting: Women's Health

## 2017-01-20 DIAGNOSIS — N760 Acute vaginitis: Secondary | ICD-10-CM

## 2017-01-20 DIAGNOSIS — B9689 Other specified bacterial agents as the cause of diseases classified elsewhere: Secondary | ICD-10-CM

## 2017-07-06 ENCOUNTER — Other Ambulatory Visit: Payer: Self-pay | Admitting: Women's Health

## 2017-07-06 DIAGNOSIS — B373 Candidiasis of vulva and vagina: Secondary | ICD-10-CM

## 2017-07-06 DIAGNOSIS — B3731 Acute candidiasis of vulva and vagina: Secondary | ICD-10-CM

## 2017-07-11 ENCOUNTER — Encounter: Payer: Self-pay | Admitting: Women's Health

## 2017-07-11 ENCOUNTER — Ambulatory Visit: Payer: BLUE CROSS/BLUE SHIELD | Admitting: Women's Health

## 2017-07-11 VITALS — BP 110/80

## 2017-07-11 DIAGNOSIS — R300 Dysuria: Secondary | ICD-10-CM | POA: Diagnosis not present

## 2017-07-11 DIAGNOSIS — B373 Candidiasis of vulva and vagina: Secondary | ICD-10-CM

## 2017-07-11 DIAGNOSIS — B3731 Acute candidiasis of vulva and vagina: Secondary | ICD-10-CM

## 2017-07-11 LAB — WET PREP FOR TRICH, YEAST, CLUE

## 2017-07-11 MED ORDER — FLUCONAZOLE 100 MG PO TABS: ORAL_TABLET | ORAL | 0 refills | Status: DC

## 2017-07-11 NOTE — Patient Instructions (Signed)

## 2017-07-11 NOTE — Progress Notes (Signed)
47 year old MWF G2, P2 presents with complaint of white, chunky vaginal discharge, vaginal burning, vaginal redness, vaginal itching, urinary burning, and urinary frequency for 4 days. Recently completed antibiotic course for UTI. History of yeast infections after antibiotic use. Yeast infection symptoms are similar to symptoms today. Denies dyspareunia, back pain, nausea or fever.  Postmenopausal on HRT with no bleeding.  Exam: Appears well. External genitalia within normal limits, minimal erythema  at introitus, speculum exam moderate amount of white discharge noted wet prep negative for yeast. Bimanual no CMT or adnexal tenderness. No CVA tenderness. UA: Negative leukocytes, negative nitrites, +1 ketones, 0-5 WBCs, 0-2 RBCs, 10-20 squamous epithelials, moderate bacteria,  Symptomatic yeast vaginitis  Plan: Diflucan 200 mg by mouth. Repeat in 3 days if needed. Prescription proper use given.  Instructed to call if symptoms persist.  Make annual physical exam, overdue.  Mammogram overdue instructed to schedule. Discussed importance of smoking cessation.  Reviewed importance of increasing fluids and urine culture pending.

## 2017-07-13 LAB — URINALYSIS, COMPLETE W/RFL CULTURE
Bilirubin Urine: NEGATIVE
GLUCOSE, UA: NEGATIVE
Hgb urine dipstick: NEGATIVE
Hyaline Cast: NONE SEEN /LPF
LEUKOCYTE ESTERASE: NEGATIVE
NITRITES URINE, INITIAL: NEGATIVE
PH: 5 (ref 5.0–8.0)
SPECIFIC GRAVITY, URINE: 1.033 (ref 1.001–1.03)

## 2017-07-13 LAB — URINE CULTURE
MICRO NUMBER: 90733719
SPECIMEN QUALITY:: ADEQUATE

## 2017-07-13 LAB — CULTURE INDICATED

## 2017-09-19 ENCOUNTER — Encounter: Payer: BLUE CROSS/BLUE SHIELD | Admitting: Women's Health

## 2017-11-14 ENCOUNTER — Ambulatory Visit: Payer: 59 | Admitting: Women's Health

## 2017-11-14 ENCOUNTER — Encounter: Payer: Self-pay | Admitting: Women's Health

## 2017-11-14 VITALS — BP 102/82

## 2017-11-14 DIAGNOSIS — R300 Dysuria: Secondary | ICD-10-CM | POA: Diagnosis not present

## 2017-11-14 DIAGNOSIS — N9489 Other specified conditions associated with female genital organs and menstrual cycle: Secondary | ICD-10-CM

## 2017-11-14 DIAGNOSIS — N949 Unspecified condition associated with female genital organs and menstrual cycle: Secondary | ICD-10-CM

## 2017-11-14 LAB — WET PREP FOR TRICH, YEAST, CLUE

## 2017-11-14 MED ORDER — ESTRADIOL 10 MCG VA TABS: ORAL_TABLET | VAGINAL | 4 refills | Status: DC

## 2017-11-14 NOTE — Progress Notes (Signed)
47 year old M WF G2 P2 presents with complaint of vaginal burning, discomfort with intercourse, scant amount of a white discharge without itching or odor the past few days.  Also noted a bump on her labia and questions what it is.  Slight burning with urination without pain at end of stream or change in frequency or urgency.  Denies back pain, fever, nausea or changes in bowel elimination.  On HRT with no bleeding.  Long-term history of pelvic pain, probable endometriosis.  Exam: Appears well.  No CVAT.  Abdomen obese, nontender, external genitalia left labia  half centimeter sebaceous cyst noninflamed, speculum exam mild vaginal atrophy, minimal white discharge, no erythema or odor noted, wet prep negative.  Bimanual slight tenderness throughout exam no fullness in the adnexa. UA: +1 blood, negative nitrites, negative leukocytes, no WBCs,  10-20 RBCs, few bacteria  Vaginal burning/atrophy  Plan: Urine culture pending.  Reviewed mild vaginal dryness, negative wet prep, options reviewed, will try vaginal estrogen, Vagifem 1 applicator per vagina daily for 2 weeks and then twice weekly thereafter.  Continue over-the-counter vaginal lubricants.  Instructed to call if continued problems.  Keep scheduled annual exam in December will evaluate.

## 2017-11-16 LAB — URINALYSIS, COMPLETE W/RFL CULTURE
BILIRUBIN URINE: NEGATIVE
Glucose, UA: NEGATIVE
HYALINE CAST: NONE SEEN /LPF
Ketones, ur: NEGATIVE
Leukocyte Esterase: NEGATIVE
Nitrites, Initial: NEGATIVE
PROTEIN: NEGATIVE
Specific Gravity, Urine: 1.025 (ref 1.001–1.03)
WBC, UA: NONE SEEN /HPF (ref 0–5)
pH: 6 (ref 5.0–8.0)

## 2017-11-16 LAB — URINE CULTURE
MICRO NUMBER:: 91272709
Result:: NO GROWTH
SPECIMEN QUALITY: ADEQUATE

## 2017-11-16 LAB — CULTURE INDICATED

## 2018-01-09 ENCOUNTER — Ambulatory Visit: Payer: 59 | Admitting: Women's Health

## 2018-01-09 ENCOUNTER — Encounter: Payer: Self-pay | Admitting: Women's Health

## 2018-01-09 VITALS — BP 124/78 | Ht 62.0 in | Wt 227.0 lb

## 2018-01-09 DIAGNOSIS — Z13220 Encounter for screening for lipoid disorders: Secondary | ICD-10-CM | POA: Diagnosis not present

## 2018-01-09 DIAGNOSIS — Z01419 Encounter for gynecological examination (general) (routine) without abnormal findings: Secondary | ICD-10-CM | POA: Diagnosis not present

## 2018-01-09 DIAGNOSIS — R635 Abnormal weight gain: Secondary | ICD-10-CM | POA: Diagnosis not present

## 2018-01-09 DIAGNOSIS — Z79890 Hormone replacement therapy (postmenopausal): Secondary | ICD-10-CM

## 2018-01-09 MED ORDER — ESTRADIOL 10 MCG VA TABS: ORAL_TABLET | VAGINAL | 4 refills | Status: DC

## 2018-01-09 MED ORDER — FLUCONAZOLE 100 MG PO TABS: ORAL_TABLET | ORAL | 0 refills | Status: DC

## 2018-01-09 MED ORDER — PROGESTERONE MICRONIZED 100 MG PO CAPS: 100.0000 mg | ORAL_CAPSULE | Freq: Every day | ORAL | 3 refills | Status: DC

## 2018-01-09 MED ORDER — ESTRADIOL 0.5 MG PO TABS: 0.5000 mg | ORAL_TABLET | Freq: Every day | ORAL | 4 refills | Status: DC

## 2018-01-09 NOTE — Progress Notes (Signed)
Tammy Huber 10/29/1970 [id number]    History:    Presents for annual exam.  Menopausal on HRT with no eating.  States is having increased hot flushes, did stop estradiol, when starting on Vagifem which did help with vaginal burning and dryness.  Normal Pap history has never had a mammogram.  [name] care manages anxiety/depression bipolar disease.  Occasional smoking.  Past medical history, past surgical history, family history and social history were all reviewed and documented in the EPIC chart.  Owns her own business.  2 sons and 2 granddaughters.  ROS:  A ROS was performed and pertinent positives and negatives are included.  Exam:  Vitals:   01/09/18 1146  BP: 124/78  Weight: 227 lb (103 kg)  Height: 5\' 2"  (1.575 m)   Body mass index is 41.52 kg/m.   General appearance:  Normal Thyroid:  Symmetrical, normal in size, without palpable masses or nodularity. Respiratory  Auscultation:  Clear without wheezing or rhonchi Cardiovascular  Auscultation:  Regular rate, without rubs, murmurs or gallops  Edema/varicosities:  Not grossly evident Abdominal  Soft,nontender, without masses, guarding or rebound.  Liver/spleen:  No organomegaly noted  Hernia:  None appreciated  Skin  Inspection:  Grossly normal   Breasts: Examined lying and sitting.     Right: Without masses, retractions, discharge or axillary adenopathy.     Left: Without masses, retractions, discharge or axillary adenopathy. Gentitourinary   Inguinal/mons:  Normal without inguinal adenopathy  External genitalia:  Normal  BUS/Urethra/Skene's glands:  Normal  Vagina:  Normal  Cervix:  Normal  Uterus:   normal in size, shape and contour.  Midline and mobile  Adnexa/parametria:     Rt: Without masses or tenderness.   Lt: Without masses or tenderness.  Anus and perineum: Normal  Digital rectal exam: Normal sphincter tone without palpated masses or tenderness  Assessment/Plan:  47 y.o. MWF G2 P2 for annual exam  with hot flushes.  Postmenopausal on HRT with no bleeding Obesity Bipolar/anxiety and depression - primary care manages Smoker  Plan: Instructed to start back on estradiol at 5 mg by mouth tablet daily continue Vagifem 1 applicator per vagina twice weekly.  Reviewed vaginal estrogen minimal systemic absorption, instructed to call if continued hot flashes.  Continue Prometrium 100 mg at bedtime daily.  SBEs, reviewed importance of annual screening mammogram, breast center information given instructed to schedule.  Reviewed importance of increasing regular exercise and decreasing calorie/carbs.  Vitamin D 2000 daily encouraged.  Hazards of smoking, reviewed importance of no smoking, tips to quit reviewed.  Refill of Diflucan 100 mg to take as needed, history of recurrent yeast currently asymptomatic.  Pap normal 2018, new screening guidelines reviewed.  CBC, CMP, lipid panel, TSH.   [name] [facility]NP, 1:20 PM 01/09/2018

## 2018-01-09 NOTE — Patient Instructions (Signed)
Health Maintenance for Postmenopausal Women Menopause is a normal process in which your reproductive ability comes to an end. This process happens gradually over a span of months to years, usually between the ages of 22 and 9. Menopause is complete when you have missed 12 consecutive menstrual periods. It is important to talk with your health care provider about some of the most common conditions that affect postmenopausal women, such as heart disease, cancer, and bone loss (osteoporosis). Adopting a healthy lifestyle and getting preventive care can help to promote your health and wellness. Those actions can also lower your chances of developing some of these common conditions. What should I know about menopause? During menopause, you may experience a number of symptoms, such as:  Moderate-to-severe hot flashes.  Night sweats.  Decrease in sex drive.  Mood swings.  Headaches.  Tiredness.  Irritability.  Memory problems.  Insomnia.  Choosing to treat or not to treat menopausal changes is an individual decision that you make with your health care provider. What should I know about hormone replacement therapy and supplements? Hormone therapy products are effective for treating symptoms that are associated with menopause, such as hot flashes and night sweats. Hormone replacement carries certain risks, especially as you become older. If you are thinking about using estrogen or estrogen with progestin treatments, discuss the benefits and risks with your health care provider. What should I know about heart disease and stroke? Heart disease, heart attack, and stroke become more likely as you age. This may be due, in part, to the hormonal changes that your body experiences during menopause. These can affect how your body processes dietary fats, triglycerides, and cholesterol. Heart attack and stroke are both medical emergencies. There are many things that you can do to help prevent heart disease  and stroke:  Have your blood pressure checked at least every 1-2 years. High blood pressure causes heart disease and increases the risk of stroke.  If you are 53-22 years old, ask your health care provider if you should take aspirin to prevent a heart attack or a stroke.  Do not use any tobacco products, including cigarettes, chewing tobacco, or electronic cigarettes. If you need help quitting, ask your health care provider.  It is important to eat a healthy diet and maintain a healthy weight. ? Be sure to include plenty of vegetables, fruits, low-fat dairy products, and lean protein. ? Avoid eating foods that are high in solid fats, added sugars, or salt (sodium).  Get regular exercise. This is one of the most important things that you can do for your health. ? Try to exercise for at least 150 minutes each week. The type of exercise that you do should increase your heart rate and make you sweat. This is known as moderate-intensity exercise. ? Try to do strengthening exercises at least twice each week. Do these in addition to the moderate-intensity exercise.  Know your numbers.Ask your health care provider to check your cholesterol and your blood glucose. Continue to have your blood tested as directed by your health care provider.  What should I know about cancer screening? There are several types of cancer. Take the following steps to reduce your risk and to catch any cancer development as early as possible. Breast Cancer  Practice breast self-awareness. ? This means understanding how your breasts normally appear and feel. ? It also means doing regular breast self-exams. Let your health care provider know about any changes, no matter how small.  If you are 40  or older, have a clinician do a breast exam (clinical breast exam or CBE) every year. Depending on your age, family history, and medical history, it may be recommended that you also have a yearly breast X-ray (mammogram).  If you  have a family history of breast cancer, talk with your health care provider about genetic screening.  If you are at high risk for breast cancer, talk with your health care provider about having an MRI and a mammogram every year.  Breast cancer (BRCA) gene test is recommended for women who have family members with BRCA-related cancers. Results of the assessment will determine the need for genetic counseling and BRCA1 and for BRCA2 testing. BRCA-related cancers include these types: ? Breast. This occurs in males or females. ? Ovarian. ? Tubal. This may also be called fallopian tube cancer. ? Cancer of the abdominal or pelvic lining (peritoneal cancer). ? Prostate. ? Pancreatic.  Cervical, Uterine, and Ovarian Cancer Your health care provider may recommend that you be screened regularly for cancer of the pelvic organs. These include your ovaries, uterus, and vagina. This screening involves a pelvic exam, which includes checking for microscopic changes to the surface of your cervix (Pap test).  For women ages 21-65, health care providers may recommend a pelvic exam and a Pap test every three years. For women ages 79-65, they may recommend the Pap test and pelvic exam, combined with testing for human papilloma virus (HPV), every five years. Some types of HPV increase your risk of cervical cancer. Testing for HPV may also be done on women of any age who have unclear Pap test results.  Other health care providers may not recommend any screening for nonpregnant women who are considered low risk for pelvic cancer and have no symptoms. Ask your health care provider if a screening pelvic exam is right for you.  If you have had past treatment for cervical cancer or a condition that could lead to cancer, you need Pap tests and screening for cancer for at least 20 years after your treatment. If Pap tests have been discontinued for you, your risk factors (such as having a new sexual partner) need to be  reassessed to determine if you should start having screenings again. Some women have medical problems that increase the chance of getting cervical cancer. In these cases, your health care provider may recommend that you have screening and Pap tests more often.  If you have a family history of uterine cancer or ovarian cancer, talk with your health care provider about genetic screening.  If you have vaginal bleeding after reaching menopause, tell your health care provider.  There are currently no reliable tests available to screen for ovarian cancer.  Lung Cancer Lung cancer screening is recommended for adults 69-62 years old who are at high risk for lung cancer because of a history of smoking. A yearly low-dose CT scan of the lungs is recommended if you:  Currently smoke.  Have a history of at least 30 pack-years of smoking and you currently smoke or have quit within the past 15 years. A pack-year is smoking an average of one pack of cigarettes per day for one year.  Yearly screening should:  Continue until it has been 15 years since you quit.  Stop if you develop a health problem that would prevent you from having lung cancer treatment.  Colorectal Cancer  This type of cancer can be detected and can often be prevented.  Routine colorectal cancer screening usually begins at  age 42 and continues through age 45.  If you have risk factors for colon cancer, your health care provider may recommend that you be screened at an earlier age.  If you have a family history of colorectal cancer, talk with your health care provider about genetic screening.  Your health care provider may also recommend using home test kits to check for hidden blood in your stool.  A small camera at the end of a tube can be used to examine your colon directly (sigmoidoscopy or colonoscopy). This is done to check for the earliest forms of colorectal cancer.  Direct examination of the colon should be repeated every  5-10 years until age 71. However, if early forms of precancerous polyps or small growths are found or if you have a family history or genetic risk for colorectal cancer, you may need to be screened more often.  Skin Cancer  Check your skin from head to toe regularly.  Monitor any moles. Be sure to tell your health care provider: ? About any new moles or changes in moles, especially if there is a change in a mole's shape or color. ? If you have a mole that is larger than the size of a pencil eraser.  If any of your family members has a history of skin cancer, especially at a Blimi Godby age, talk with your health care provider about genetic screening.  Always use sunscreen. Apply sunscreen liberally and repeatedly throughout the day.  Whenever you are outside, protect yourself by wearing long sleeves, pants, a wide-brimmed hat, and sunglasses.  What should I know about osteoporosis? Osteoporosis is a condition in which bone destruction happens more quickly than new bone creation. After menopause, you may be at an increased risk for osteoporosis. To help prevent osteoporosis or the bone fractures that can happen because of osteoporosis, the following is recommended:  If you are 46-71 years old, get at least 1,000 mg of calcium and at least 600 mg of vitamin D per day.  If you are older than age 55 but younger than age 65, get at least 1,200 mg of calcium and at least 600 mg of vitamin D per day.  If you are older than age 54, get at least 1,200 mg of calcium and at least 800 mg of vitamin D per day.  Smoking and excessive alcohol intake increase the risk of osteoporosis. Eat foods that are rich in calcium and vitamin D, and do weight-bearing exercises several times each week as directed by your health care provider. What should I know about how menopause affects my mental health? Depression may occur at any age, but it is more common as you become older. Common symptoms of depression  include:  Low or sad mood.  Changes in sleep patterns.  Changes in appetite or eating patterns.  Feeling an overall lack of motivation or enjoyment of activities that you previously enjoyed.  Frequent crying spells.  Talk with your health care provider if you think that you are experiencing depression. What should I know about immunizations? It is important that you get and maintain your immunizations. These include:  Tetanus, diphtheria, and pertussis (Tdap) booster vaccine.  Influenza every year before the flu season begins.  Pneumonia vaccine.  Shingles vaccine.  Your health care provider may also recommend other immunizations. This information is not intended to replace advice given to you by your health care provider. Make sure you discuss any questions you have with your health care provider. Document Released: 03/04/2005  Document Revised: 07/31/2015 Document Reviewed: 10/14/2014 Elsevier Interactive Patient Education  2018 [employer]  

## 2018-01-10 ENCOUNTER — Other Ambulatory Visit: Payer: Self-pay

## 2018-01-12 LAB — CBC WITH DIFFERENTIAL/PLATELET
Absolute Monocytes: 468 cells/uL (ref 200–950)
BASOS ABS: 47 {cells}/uL (ref 0–200)
Basophils Relative: 0.4 %
EOS ABS: 246 {cells}/uL (ref 15–500)
Eosinophils Relative: 2.1 %
HEMATOCRIT: 43.8 % (ref 35.0–45.0)
HEMOGLOBIN: 14.5 g/dL (ref 11.7–15.5)
LYMPHS ABS: 3569 {cells}/uL (ref 850–3900)
MCH: 28.5 pg (ref 27.0–33.0)
MCHC: 33.1 g/dL (ref 32.0–36.0)
MCV: 86.2 fL (ref 80.0–100.0)
MPV: 9.8 fL (ref 7.5–12.5)
Monocytes Relative: 4 %
NEUTROS ABS: 7371 {cells}/uL (ref 1500–7800)
Neutrophils Relative %: 63 %
Platelets: 310 10*3/uL (ref 140–400)
RBC: 5.08 10*6/uL (ref 3.80–5.10)
RDW: 13.3 % (ref 11.0–15.0)
Total Lymphocyte: 30.5 %
WBC: 11.7 10*3/uL — ABNORMAL HIGH (ref 3.8–10.8)

## 2018-01-12 LAB — COMPREHENSIVE METABOLIC PANEL
AG RATIO: 1.6 (calc) (ref 1.0–2.5)
ALBUMIN MSPROF: 4.2 g/dL (ref 3.6–5.1)
ALKALINE PHOSPHATASE (APISO): 86 U/L (ref 33–115)
ALT: 23 U/L (ref 6–29)
AST: 15 U/L (ref 10–35)
BUN: 14 mg/dL (ref 7–25)
CHLORIDE: 104 mmol/L (ref 98–110)
CO2: 25 mmol/L (ref 20–32)
Calcium: 9.5 mg/dL (ref 8.6–10.2)
Creat: 0.76 mg/dL (ref 0.50–1.10)
GLOBULIN: 2.6 g/dL (ref 1.9–3.7)
GLUCOSE: 100 mg/dL — AB (ref 65–99)
POTASSIUM: 4.3 mmol/L (ref 3.5–5.3)
Sodium: 139 mmol/L (ref 135–146)
Total Bilirubin: 0.3 mg/dL (ref 0.2–1.2)
Total Protein: 6.8 g/dL (ref 6.1–8.1)

## 2018-01-12 LAB — LIPID PANEL
Cholesterol: 165 mg/dL (ref <200)
HDL: 50 mg/dL — AB (ref >50)
LDL Cholesterol (Calc): 101 mg/dL (calc) — ABNORMAL HIGH
NON-HDL CHOLESTEROL (CALC): 115 mg/dL (ref <130)
Total CHOL/HDL Ratio: 3.3 (calc) (ref <5.0)
Triglycerides: 48 mg/dL (ref <150)

## 2018-01-12 LAB — HEMOGLOBIN A1C
Hgb A1c MFr Bld: 5.5 % of total Hgb (ref <5.7)
Mean Plasma Glucose: 111 (calc)
eAG (mmol/L): 6.2 (calc)

## 2018-01-12 LAB — TEST AUTHORIZATION

## 2018-01-12 LAB — TSH: TSH: 1.26 mIU/L

## 2018-01-15 ENCOUNTER — Telehealth: Payer: Self-pay

## 2018-01-15 NOTE — Telephone Encounter (Signed)
What is the medication?

## 2018-01-15 NOTE — Telephone Encounter (Signed)
Note from the pharmacy.  "Plan requires specific directions to process the prescription Please indicate the frequency of how patient will be using the medication and days supply limitation."

## 2018-01-19 ENCOUNTER — Other Ambulatory Visit: Payer: Self-pay | Admitting: Women's Health

## 2018-01-19 MED ORDER — FLUCONAZOLE 100 MG PO TABS: ORAL_TABLET | ORAL | 0 refills | Status: DC

## 2018-01-19 NOTE — Telephone Encounter (Signed)
The medication is Diflucan 100 mg tablets you prescribed on 01/09/18

## 2018-01-22 NOTE — Telephone Encounter (Signed)
Diflucan 100 mg 1 to 2 tablets weekly as needed #30 no refills.

## 2018-01-23 NOTE — Telephone Encounter (Signed)
Tammy SineNancy sent Rx.

## 2018-09-07 ENCOUNTER — Other Ambulatory Visit: Payer: Self-pay

## 2018-09-10 ENCOUNTER — Other Ambulatory Visit: Payer: Self-pay

## 2018-09-10 ENCOUNTER — Ambulatory Visit: Payer: 59 | Admitting: Women's Health

## 2018-09-10 ENCOUNTER — Encounter: Payer: Self-pay | Admitting: Women's Health

## 2018-09-10 VITALS — BP 120/80

## 2018-09-10 DIAGNOSIS — N949 Unspecified condition associated with female genital organs and menstrual cycle: Secondary | ICD-10-CM

## 2018-09-10 DIAGNOSIS — R300 Dysuria: Secondary | ICD-10-CM

## 2018-09-10 DIAGNOSIS — N9489 Other specified conditions associated with female genital organs and menstrual cycle: Secondary | ICD-10-CM

## 2018-09-10 LAB — WET PREP FOR TRICH, YEAST, CLUE

## 2018-09-10 MED ORDER — PREMARIN 0.625 MG/GM VA CREA: TOPICAL_CREAM | VAGINAL | 4 refills | Status: DC

## 2018-09-10 NOTE — Patient Instructions (Signed)
Breast center [address] [place], [place] please schedule 3D mammogram

## 2018-09-10 NOTE — Progress Notes (Signed)
48 year old MWF G2, P2 presents with complaint of vaginal burning with itching and pain with intercourse despite lubricants.  Denies vaginal discharge, urinary frequency, urgency or pain at end of stream with occasional urinary burning.  Denies abdominal/back pain or fever.  Postmenopausal on no HRT with no bleeding.  Had tried Vagifem did not like, had increased anxiety when on HRT.  Primary care manages anxiety/depression bipolar disease.  Has not had a screening mammogram.    Exam: Appears well.  No CVAT.  Abdomen obese, nontender, no rebound, external genitalia within normal limits, speculum exam atrophic, no visible discharge, wet prep negative yeast, clues, moderate white cells, many bacteria, bimanual no CMT or adnexal tenderness. UA: Negative nitrites, negative leukocytes, no WBCs, 0-2 RBCs, no bacteria  Postmenopausal vaginal atrophy  Plan: Options reviewed, will try Premarin vaginal cream 1 applicator 1.696 per vagina 3 times weekly, instructed to call if no relief of symptoms.  Continue over-the-counter vaginal lubricants.  Reviewed minimal systemic absorption of vaginal cream.  Reviewed importance of annual screening mammogram, breast center information given instructed to schedule.

## 2018-09-12 ENCOUNTER — Encounter: Payer: Self-pay | Admitting: Women's Health

## 2018-09-12 LAB — URINE CULTURE
MICRO NUMBER:: 783176
Result:: NO GROWTH
SPECIMEN QUALITY:: ADEQUATE

## 2018-09-12 LAB — URINALYSIS, COMPLETE W/RFL CULTURE
Bacteria, UA: NONE SEEN /HPF
Bilirubin Urine: NEGATIVE
Glucose, UA: NEGATIVE
Hyaline Cast: NONE SEEN /LPF
Ketones, ur: NEGATIVE
Leukocyte Esterase: NEGATIVE
Nitrites, Initial: NEGATIVE
Protein, ur: NEGATIVE
Specific Gravity, Urine: 1.025 (ref 1.001–1.03)
WBC, UA: NONE SEEN /HPF (ref 0–5)
pH: 7 (ref 5.0–8.0)

## 2018-09-12 LAB — CULTURE INDICATED

## 2019-04-24 ENCOUNTER — Other Ambulatory Visit: Payer: Self-pay | Admitting: Women's Health

## 2019-07-10 ENCOUNTER — Other Ambulatory Visit: Payer: Self-pay

## 2019-07-15 ENCOUNTER — Ambulatory Visit: Payer: 59 | Admitting: Nurse Practitioner

## 2019-07-15 ENCOUNTER — Encounter: Payer: Self-pay | Admitting: Nurse Practitioner

## 2019-07-15 ENCOUNTER — Other Ambulatory Visit: Payer: Self-pay

## 2019-07-15 ENCOUNTER — Telehealth: Payer: Self-pay

## 2019-07-15 VITALS — BP 124/78 | Ht 62.0 in | Wt 238.0 lb

## 2019-07-15 DIAGNOSIS — N898 Other specified noninflammatory disorders of vagina: Secondary | ICD-10-CM | POA: Diagnosis not present

## 2019-07-15 DIAGNOSIS — Z01419 Encounter for gynecological examination (general) (routine) without abnormal findings: Secondary | ICD-10-CM

## 2019-07-15 DIAGNOSIS — Z79890 Hormone replacement therapy (postmenopausal): Secondary | ICD-10-CM

## 2019-07-15 DIAGNOSIS — R300 Dysuria: Secondary | ICD-10-CM | POA: Diagnosis not present

## 2019-07-15 LAB — URINALYSIS, COMPLETE W/RFL CULTURE
Bacteria, UA: NONE SEEN /HPF
Bilirubin Urine: NEGATIVE
Glucose, UA: NEGATIVE
Hgb urine dipstick: NEGATIVE
Hyaline Cast: NONE SEEN /LPF
Ketones, ur: NEGATIVE
Leukocyte Esterase: NEGATIVE
Nitrites, Initial: NEGATIVE
Protein, ur: NEGATIVE
RBC / HPF: NONE SEEN /HPF (ref 0–2)
Specific Gravity, Urine: 1.02 (ref 1.001–1.03)
WBC, UA: NONE SEEN /HPF (ref 0–5)
pH: 6.5 (ref 5.0–8.0)

## 2019-07-15 LAB — NO CULTURE INDICATED

## 2019-07-15 LAB — WET PREP FOR TRICH, YEAST, CLUE

## 2019-07-15 MED ORDER — FLUCONAZOLE 100 MG PO TABS: ORAL_TABLET | ORAL | 0 refills | Status: AC

## 2019-07-15 MED ORDER — PROGESTERONE MICRONIZED 100 MG PO CAPS: 100.0000 mg | ORAL_CAPSULE | Freq: Every day | ORAL | 11 refills | Status: AC

## 2019-07-15 MED ORDER — ESTRADIOL 0.5 MG PO TABS: 0.5000 mg | ORAL_TABLET | Freq: Every day | ORAL | 4 refills | Status: AC

## 2019-07-15 MED ORDER — PREMARIN 0.625 MG/GM VA CREA: TOPICAL_CREAM | VAGINAL | 4 refills | Status: DC

## 2019-07-15 NOTE — Progress Notes (Signed)
   Tammy Huber 02/02/1970 [id number]   History:  49 y.o. MWF G2 P2 presents for annual exam with complaints of dysuria and vaginal burning with intercourse that started 2 weeks ago.  Denies vaginal itching or odor, urinary frequency, urgency, or hematuria.  Has had pain with intercourse all her life.  Interested in a hysterectomy.  Postmenopausal- no bleeding.  On hormone replacement therapy but has not been taking because she ran out.  History of recurrent yeast infections, takes Diflucan as needed.  Normal pap history.  Has not had a mammogram.  Gynecologic History Patient's last menstrual period was 07/22/2013.   Contraception: post menopausal status Last Pap: 03/09/2016. Results were: normal Last mammogram: normal   Past medical history, past surgical history, family history and social history were all reviewed and documented in the EPIC chart.  ROS:  A ROS was performed and pertinent positives and negatives are included.  Exam:  Vitals:   07/15/19 0942  BP: 124/78  Weight: 238 lb (108 kg)  Height: 5\' 2"  (1.575 m)   Body mass index is 43.53 kg/m.  General appearance:  Normal Thyroid:  Symmetrical, normal in size, without palpable masses or nodularity. Respiratory  Auscultation:  Clear without wheezing or rhonchi Cardiovascular  Auscultation:  Regular rate, without rubs, murmurs or gallops  Edema/varicosities:  Not grossly evident Abdominal  Soft,nontender, without masses, guarding or rebound.  Liver/spleen:  No organomegaly noted  Hernia:  None appreciated  Skin  Inspection:  Grossly normal   Breasts: Examined lying and sitting.   Right: Without masses, retractions, discharge or axillary adenopathy.   Left: Without masses, retractions, discharge or axillary adenopathy. Gentitourinary   Inguinal/mons:  Normal without inguinal adenopathy  External genitalia:  Normal  BUS/Urethra/Skene's glands:  Normal  Vagina:  Normal, atrophic changes, minimal  discharge  Cervix:  Normal  Uterus:  Normal in size, shape and contour.  Midline and mobile  Adnexa/parametria:     Rt: Without masses or tenderness.   Lt: Without masses or tenderness.  Anus and perineum: Normal  Wet prep negative UA: Negative leukocytes, no WBC, no RBC, no bacteria  Assessment/Plan:  49 y.o. G2 P2 for annual exam.   Well female exam with routine gynecological exam - Plan: CBC with Differential/Platelet, Comprehensive metabolic panel. Education provided on SBEs, importance of preventative screenings, current guidelines, high calcium diet, regular exercise, and multivitamin daily.   Screening for breast cancer - Has not had a mammogram. Encouraged to schedule this soon. Information provided on [facility].   Postmenopausal HRT (hormone replacement therapy) - Plan: conjugated estrogens (PREMARIN) vaginal cream, fluconazole (DIFLUCAN) 100 MG tablet, progesterone (PROMETRIUM) 100 MG capsule, estradiol (ESTRACE) 0.5 MG tablet. Instructed to use vaginal cream daily for 1-2 weeks, then decrease to every other day, then twice weekly.   Dysuria - Plan: Urinalysis,Complete w/RFL Culture. UA unremarkable. Likely from vaginal irritation.   Vaginal irritation - Plan: WET PREP FOR TRICH, YEAST, CLUE. Wet prep unremarkable. Most likely from vaginal dryness secondary to menopause and not using vaginal estrogen cream.   Would like to discuss options for hysterectomy due to painful intercourse x 30 years. She is aware that she may not be a candidate for surgery but would like to discuss her options.   Follow up in 1 year for annual     [address] [facility], 10:11 AM 07/15/2019

## 2019-07-15 NOTE — Patient Instructions (Addendum)
Schedule mammogram! [facility] [phone] [address]  [place], [place] [address]  Health Maintenance, Female Adopting a healthy lifestyle and getting preventive care are important in promoting health and wellness. Ask your health care provider about:  The right schedule for you to have regular tests and exams.  Things you can do on your own to prevent diseases and keep yourself healthy. What should I know about diet, weight, and exercise? Eat a healthy diet   Eat a diet that includes plenty of vegetables, fruits, low-fat dairy products, and lean protein.  Do not eat a lot of foods that are high in solid fats, added sugars, or sodium. Maintain a healthy weight Body mass index (BMI) is used to identify weight problems. It estimates body fat based on height and weight. Your health care provider can help determine your BMI and help you achieve or maintain a healthy weight. Get regular exercise Get regular exercise. This is one of the most important things you can do for your health. Most adults should:  Exercise for at least 150 minutes each week. The exercise should increase your heart rate and make you sweat (moderate-intensity exercise).  Do strengthening exercises at least twice a week. This is in addition to the moderate-intensity exercise.  Spend less time sitting. Even light physical activity can be beneficial. Watch cholesterol and blood lipids Have your blood tested for lipids and cholesterol at 48 years of age, then have this test every 5 years. Have your cholesterol levels checked more often if:  Your lipid or cholesterol levels are high.  You are older than 48 years of age.  You are at high risk for heart disease. What should I know about cancer screening? Depending on your health history and family history, you may need to have cancer screening at various ages. This may include screening for:  Breast cancer.  Cervical  cancer.  Colorectal cancer.  Skin cancer.  Lung cancer. What should I know about heart disease, diabetes, and high blood pressure? Blood pressure and heart disease  High blood pressure causes heart disease and increases the risk of stroke. This is more likely to develop in people who have high blood pressure readings, are of African descent, or are overweight.  Have your blood pressure checked: ? Every 3-5 years if you are 18-39 years of age. ? Every year if you are 40 years old or older. Diabetes Have regular diabetes screenings. This checks your fasting blood sugar level. Have the screening done:  Once every three years after age 40 if you are at a normal weight and have a low risk for diabetes.  More often and at a younger age if you are overweight or have a high risk for diabetes. What should I know about preventing infection? Hepatitis B If you have a higher risk for hepatitis B, you should be screened for this virus. Talk with your health care provider to find out if you are at risk for hepatitis B infection. Hepatitis C Testing is recommended for:  Everyone born from 1945 through 1965.  Anyone with known risk factors for hepatitis C. Sexually transmitted infections (STIs)  Get screened for STIs, including gonorrhea and chlamydia, if: ? You are sexually active and are younger than 48 years of age. ? You are older than 48 years of age and your health care provider tells you that you are at risk for this type of infection. ? Your sexual activity has changed since you   were last screened, and you are at increased risk for chlamydia or gonorrhea. Ask your health care provider if you are at risk.  Ask your health care provider about whether you are at high risk for HIV. Your health care provider may recommend a prescription medicine to help prevent HIV infection. If you choose to take medicine to prevent HIV, you should first get tested for HIV. You should then be tested every 3  months for as long as you are taking the medicine. Pregnancy  If you are about to stop having your period (premenopausal) and you may become pregnant, seek counseling before you get pregnant.  Take 400 to 800 micrograms (mcg) of folic acid every day if you become pregnant.  Ask for birth control (contraception) if you want to prevent pregnancy. Osteoporosis and menopause Osteoporosis is a disease in which the bones lose minerals and strength with aging. This can result in bone fractures. If you are 65 years old or older, or if you are at risk for osteoporosis and fractures, ask your health care provider if you should:  Be screened for bone loss.  Take a calcium or vitamin D supplement to lower your risk of fractures.  Be given hormone replacement therapy (HRT) to treat symptoms of menopause. Follow these instructions at home: Lifestyle  Do not use any products that contain nicotine or tobacco, such as cigarettes, e-cigarettes, and chewing tobacco. If you need help quitting, ask your health care provider.  Do not use street drugs.  Do not share needles.  Ask your health care provider for help if you need support or information about quitting drugs. Alcohol use  Do not drink alcohol if: ? Your health care provider tells you not to drink. ? You are pregnant, may be pregnant, or are planning to become pregnant.  If you drink alcohol: ? Limit how much you use to 0-1 drink a day. ? Limit intake if you are breastfeeding.  Be aware of how much alcohol is in your drink. In the [place] one drink equals one 12 oz bottle of beer (355 mL), one 5 oz glass of wine (148 mL), or one 1 oz glass of hard liquor (44 mL). General instructions  Schedule regular health, dental, and eye exams.  Stay current with your vaccines.  Tell your health care provider if: ? You often feel depressed. ? You have ever been abused or do not feel safe at home. Summary  Adopting a healthy lifestyle and getting  preventive care are important in promoting health and wellness.  Follow your health care provider's instructions about healthy diet, exercising, and getting tested or screened for diseases.  Follow your health care provider's instructions on monitoring your cholesterol and blood pressure. This information is not intended to replace advice given to you by your health care provider. Make sure you discuss any questions you have with your health care provider. Document Revised: 01/03/2018 Document Reviewed: 01/03/2018 Elsevier Patient Education  2020 Elsevier Inc.  

## 2019-07-15 NOTE — Telephone Encounter (Signed)
Note from pharmacy regarding Premarin Vaginal Cream Rx.  "Premarin cream is on backorder until (maybe) July 31st 2021.  Please send Rx for alternate if appropriate."

## 2019-07-16 ENCOUNTER — Other Ambulatory Visit: Payer: Self-pay | Admitting: Nurse Practitioner

## 2019-07-16 DIAGNOSIS — N951 Menopausal and female climacteric states: Secondary | ICD-10-CM

## 2019-07-16 LAB — COMPREHENSIVE METABOLIC PANEL
AG Ratio: 1.8 (calc) (ref 1.0–2.5)
ALT: 24 U/L (ref 6–29)
AST: 18 U/L (ref 10–35)
Albumin: 4.5 g/dL (ref 3.6–5.1)
Alkaline phosphatase (APISO): 82 U/L (ref 31–125)
BUN: 14 mg/dL (ref 7–25)
CO2: 24 mmol/L (ref 20–32)
Calcium: 9.5 mg/dL (ref 8.6–10.2)
Chloride: 102 mmol/L (ref 98–110)
Creat: 0.8 mg/dL (ref 0.50–1.10)
Globulin: 2.5 g/dL (calc) (ref 1.9–3.7)
Glucose, Bld: 99 mg/dL (ref 65–99)
Potassium: 4.5 mmol/L (ref 3.5–5.3)
Sodium: 138 mmol/L (ref 135–146)
Total Bilirubin: 0.3 mg/dL (ref 0.2–1.2)
Total Protein: 7 g/dL (ref 6.1–8.1)

## 2019-07-16 LAB — CBC WITH DIFFERENTIAL/PLATELET
Absolute Monocytes: 397 cells/uL (ref 200–950)
Basophils Absolute: 73 cells/uL (ref 0–200)
Basophils Relative: 0.9 %
Eosinophils Absolute: 219 cells/uL (ref 15–500)
Eosinophils Relative: 2.7 %
HCT: 43.6 % (ref 35.0–45.0)
Hemoglobin: 14.1 g/dL (ref 11.7–15.5)
Lymphs Abs: 2859 cells/uL (ref 850–3900)
MCH: 28 pg (ref 27.0–33.0)
MCHC: 32.3 g/dL (ref 32.0–36.0)
MCV: 86.7 fL (ref 80.0–100.0)
MPV: 9.8 fL (ref 7.5–12.5)
Monocytes Relative: 4.9 %
Neutro Abs: 4552 cells/uL (ref 1500–7800)
Neutrophils Relative %: 56.2 %
Platelets: 304 10*3/uL (ref 140–400)
RBC: 5.03 10*6/uL (ref 3.80–5.10)
RDW: 13 % (ref 11.0–15.0)
Total Lymphocyte: 35.3 %
WBC: 8.1 10*3/uL (ref 3.8–10.8)

## 2019-07-16 MED ORDER — ESTRADIOL 0.1 MG/GM VA CREA: 1.0000 | TOPICAL_CREAM | Freq: Every day | VAGINAL | 11 refills | Status: AC

## 2019-07-16 NOTE — Telephone Encounter (Signed)
Sent Estrace vaginal cream to her pharmacy. Thank you.

## 2019-07-28 DIAGNOSIS — Z0289 Encounter for other administrative examinations: Secondary | ICD-10-CM

## 2019-08-07 ENCOUNTER — Ambulatory Visit: Payer: 59 | Admitting: Obstetrics and Gynecology

## 2021-04-01 ENCOUNTER — Encounter: Payer: Self-pay | Admitting: Podiatry

## 2021-04-01 ENCOUNTER — Ambulatory Visit (INDEPENDENT_AMBULATORY_CARE_PROVIDER_SITE_OTHER): Payer: 59

## 2021-04-01 ENCOUNTER — Ambulatory Visit: Payer: 59 | Admitting: Podiatry

## 2021-04-01 ENCOUNTER — Other Ambulatory Visit: Payer: Self-pay

## 2021-04-01 DIAGNOSIS — M7672 Peroneal tendinitis, left leg: Secondary | ICD-10-CM

## 2021-04-01 MED ORDER — DEXAMETHASONE SODIUM PHOSPHATE 120 MG/30ML IJ SOLN
4.0000 mg | Freq: Once | INTRAMUSCULAR | Status: AC
  Administered 2021-04-01: 14:00:00 4 mg via INTRA_ARTICULAR

## 2021-04-01 MED ORDER — MELOXICAM 15 MG PO TABS: 15.0000 mg | ORAL_TABLET | Freq: Every day | ORAL | 0 refills | Status: AC

## 2021-04-01 NOTE — Progress Notes (Signed)
?  Subjective:  ?Patient ID: Tammy Huber, female    DOB: 11-Jun-1970,   MRN: [id number] ? ?Chief Complaint  ?Patient presents with  ? Ankle Pain  ?  Left ankle pain , patient states pain is not constant it come and goes there is a lot of swelling . No redness but can do some ROM with ankle   ? ? ?51 y.o. female presents for concern of left ankle and foot pain. Was seen years ago for the same problem and concerned she re-injured it. Relates she remains pretty active but this foot has been limiting her. She uses braced and takes aleve for pain with some help.  . Denies any other pedal complaints. Denies n/v/f/c.  ? ?Past Medical History:  ?Diagnosis Date  ? Anxiety   ? Bipolar 1 disorder (HCC)   ? Pain, female pelvic   ? since 1990  ? Smoker   ? ? ?Objective:  ?Physical Exam: ?Vascular: DP/PT pulses 2/4 bilateral. CFT <3 seconds. Normal hair growth on digits. No edema.  ?Skin. No lacerations or abrasions bilateral feet.  ?Musculoskeletal: MMT 5/5 bilateral lower extremities in DF, PF, Inversion and Eversion. Deceased ROM in DF of ankle joint.  Tender to peroneal tendon posterior to lateral malleolus. Pain with eversion of the foot. No pain with DF, PF or inversion.  ?Neurological: Sensation intact to light touch.  ? ?Assessment:  ? ?1. Peroneal tendonitis, left   ? ? ? ?Plan:  ?Patient was evaluated and treated and all questions answered. ?X-rays reviewed and discussed with patient. No acute fractures or dislocations noted.  ?Discussed peroneal tendinitis and treatment options at length with patient ?Discussed stretching exercises and provided handout. ?Prescription for meloxicam provided ?Continue with bracing she has at home.  ?Requesting injection today. Procedure below. ?Discussed that if the symptoms do not improve can consider PT/MRI. ?Patient to return in 6 to 8 weeks or sooner if symptoms fail to improve or worsen. ? ?Procedure: Injection Tendon/Ligament ?Discussed alternatives, risks, complications and  verbal consent was obtained.  ?Location: Left peroneal tendon. ?Skin Prep: Alcohol. ?Injectate: 1cc 0.5% marcaine plain, 1 cc dexamethasone.  ?Disposition: Patient tolerated procedure well. Injection site dressed with a band-aid.  ?Post-injection care was discussed and return precautions discussed.  ? ? ? ?Louann Sjogren, DPM  ? ? ?

## 2021-04-01 NOTE — Patient Instructions (Signed)
Peroneal Tendinopathy Rehab ?Ask your health care provider which exercises are safe for you. Do exercises exactly as told by your health care provider and adjust them as directed. It is normal to feel mild stretching, pulling, tightness, or discomfort as you do these exercises. Stop right away if you feel sudden pain or your pain gets worse. Do not begin these exercises until told by your health care provider. ?Stretching and range-of-motion exercises ?These exercises warm up your muscles and joints and improve the movement and flexibility of your ankle. These exercises also help to relieve pain and stiffness. ?Gastroc and soleus stretch, standing ?This is an exercise in which you stand on a step and use your body weight to stretch your calf muscles. To do this exercise: ?Stand on the edge of a step on the ball of your left / right foot. The ball of your foot is on the walking surface, right under your toes. ?Keep your other foot firmly on the same step. ?Hold on to the wall, a railing, or a chair for balance. ?Slowly lift your other foot, allowing your body weight to press your left / right heel down over the edge of the step. You should feel a stretch in your left / right calf (gastrocnemius and soleus). ?Hold this position for __________ seconds. ?Return both feet to the step. ?Repeat this exercise with a slight bend in your left / right knee. ?Repeat __________ times with your left / right knee straight and __________ times with your left / right knee bent. Complete this exercise __________ times a day. ?Strengthening exercises ?These exercises build strength and endurance in your foot and ankle. Endurance is the ability to use your muscles for a long time, even after they get tired. ?Ankle dorsiflexion with band ? ?Secure a rubber exercise band or tube to an object, such as a table leg, that will not move when the band is pulled. ?Secure the other end of the band around your left / right foot. ?Sit on the  floor, facing the object with your left / right leg extended. The band or tube should be slightly tense when your foot is relaxed. ?Slowly flex your left / right ankle and toes to bring your foot toward you (dorsiflexion). ?Hold this position for __________ seconds. ?Let the band or tube slowly pull your foot back to the starting position. ?Repeat __________ times. Complete this exercise __________ times a day. ?Ankle eversion ?Sit on the floor with your legs straight out in front of you. ?Loop a rubber exercise band or tube around the ball of your left / right foot. The ball of your foot is on the walking surface, right under your toes. ?Hold the ends of the band in your hands, or secure the band to a stable object. The band or tube should be slightly tense when your foot is relaxed. ?Slowly push your foot outward, away from your other leg (eversion). ?Hold this position for __________ seconds. ?Slowly return your foot to the starting position. ?Repeat __________ times. Complete this exercise __________ times a day. ?Plantar flexion, standing ?This exercise is sometimes called standing heel raise. ?Stand with your feet shoulder-width apart. ?Place your hands on a wall or table to steady yourself as needed, but try not to use it for support. ?Keep your weight spread evenly over the width of your feet while you slowly rise up on your toes (plantar flexion). If told by your health care provider: ?Shift your weight toward your left / right   leg until you feel challenged. ?Stand on your left / right leg only. ?Hold this position for __________ seconds. ?Repeat __________ times. Complete this exercise __________ times a day. ?Single leg stand ?Without shoes, stand near a railing or in a doorway. You may hold on to the railing or door frame as needed. ?Stand on your left / right foot. Keep your big toe down on the floor and try to keep your arch lifted. ?Do not roll to the outside of your foot. ?If this exercise is too  easy, you can try it with your eyes closed or while standing on a pillow. ?Hold this position for __________ seconds. ?Repeat __________ times. Complete this exercise __________ times a day. ?This information is not intended to replace advice given to you by your health care provider. Make sure you discuss any questions you have with your health care provider. ?Document Revised: 05/01/2018 Document Reviewed: 05/01/2018 ?Elsevier Patient Education ? 2022 Elsevier Inc. ? ?

## 2021-04-29 ENCOUNTER — Other Ambulatory Visit: Payer: Self-pay | Admitting: Family Medicine

## 2021-04-29 DIAGNOSIS — M545 Low back pain, unspecified: Secondary | ICD-10-CM

## 2021-05-03 ENCOUNTER — Ambulatory Visit
Admission: RE | Admit: 2021-05-03 | Discharge: 2021-05-03 | Disposition: A | Discharge status: Home / Self Care | Payer: 59 | Source: Ambulatory Visit | Attending: Family Medicine | Admitting: Family Medicine

## 2021-05-03 DIAGNOSIS — G8929 Other chronic pain: Secondary | ICD-10-CM

## 2021-05-03 MED ORDER — METHYLPREDNISOLONE ACETATE 40 MG/ML INJ SUSP (RADIOLOG
80.0000 mg | Freq: Once | INTRAMUSCULAR | Status: AC
  Administered 2021-05-03: 80 mg via EPIDURAL

## 2021-05-03 MED ORDER — IOPAMIDOL (ISOVUE-M 200) INJECTION 41%
1.0000 mL | Freq: Once | INTRAMUSCULAR | Status: AC
  Administered 2021-05-03: 1 mL via EPIDURAL

## 2021-05-03 NOTE — Discharge Instructions (Signed)

## 2021-05-10 ENCOUNTER — Other Ambulatory Visit: Payer: 59

## 2021-05-13 ENCOUNTER — Encounter: Payer: Self-pay | Admitting: Podiatry

## 2021-05-13 ENCOUNTER — Ambulatory Visit: Payer: 59 | Admitting: Podiatry

## 2021-05-13 DIAGNOSIS — M7672 Peroneal tendinitis, left leg: Secondary | ICD-10-CM

## 2021-05-13 MED ORDER — DEXAMETHASONE SODIUM PHOSPHATE 120 MG/30ML IJ SOLN
4.0000 mg | Freq: Once | INTRAMUSCULAR | Status: AC
  Administered 2021-05-13: 4 mg via INTRA_ARTICULAR

## 2021-05-13 NOTE — Progress Notes (Signed)
?  Subjective:  ?Patient ID: Tammy Huber, female    DOB: 05-08-1970,   MRN: [id number] ? ?Chief Complaint  ?Patient presents with  ? Foot Pain  ?  Left peroneal f/u.  ? ? ?51 y.o. female presents for follow up of left peroneal tendonitis. Relates she was actually doing really well and injection had helped and having no pain up until yesterday. Relates she was in [place] last week and then went on a 2 mile walk with her granddaughter yesterday and yesterday it flared up again. Has been stretching and using brace.  Denies any other pedal complaints. Denies n/v/f/c.  ? ?Past Medical History:  ?Diagnosis Date  ? Anxiety   ? Bipolar 1 disorder ([place])   ? Pain, female pelvic   ? since 1990  ? Smoker   ? ? ?Objective:  ?Physical Exam: ?Vascular: DP/PT pulses 2/4 bilateral. CFT <3 seconds. Normal hair growth on digits. No edema.  ?Skin. No lacerations or abrasions bilateral feet.  ?Musculoskeletal: MMT 5/5 bilateral lower extremities in DF, PF, Inversion and Eversion. Deceased ROM in DF of ankle joint.  Tender to peroneal tendon posterior to lateral malleolus. Pain with eversion of the foot. No pain with DF, PF or inversion.  ?Neurological: Sensation intact to light touch.  ? ?Assessment:  ? ?1. Peroneal tendonitis, left   ? ? ? ? ?Plan:  ?Patient was evaluated and treated and all questions answered. ?X-rays reviewed and discussed with patient. No acute fractures or dislocations noted.  ?Discussed peroneal tendinitis and treatment options at length with patient ?Continue stretching.  ?Continue meloxicam as needed.  ?Continue with bracing she has at home.  ?Requesting another injection today. Procedure below. ?Discussed that if the symptoms do not improve can consider PT/MRI. ?Patient to return in 6 to 8 weeks or sooner if symptoms fail to improve or worsen. ? ?Procedure: Injection Tendon/Ligament ?Discussed alternatives, risks, complications and verbal consent was obtained.  ?Location: Left peroneal tendon. ?Skin Prep:  Alcohol. ?Injectate: 1cc 0.5% marcaine plain, 1 cc dexamethasone.  ?Disposition: Patient tolerated procedure well. Injection site dressed with a band-aid.  ?Post-injection care was discussed and return precautions discussed.  ? ? ? ?Lorenda Peck, DPM  ? ? ?

## 2021-06-24 ENCOUNTER — Ambulatory Visit: Payer: 59 | Admitting: Podiatry

## 2024-03-29 IMAGING — XA Imaging study
2 series · 2 of 2 positions shown · non-contrast
Comparison: none

CLINICAL DATA: Chronic midline low back pain. 50-year-old female
with lumbosacral spondylosis without myelopathy. She presents for
L5-S1 epidural steroid injection.

[Series 1: ortho standard · 1 of 1 slices shown (1 of 2)]
[im 1/1]
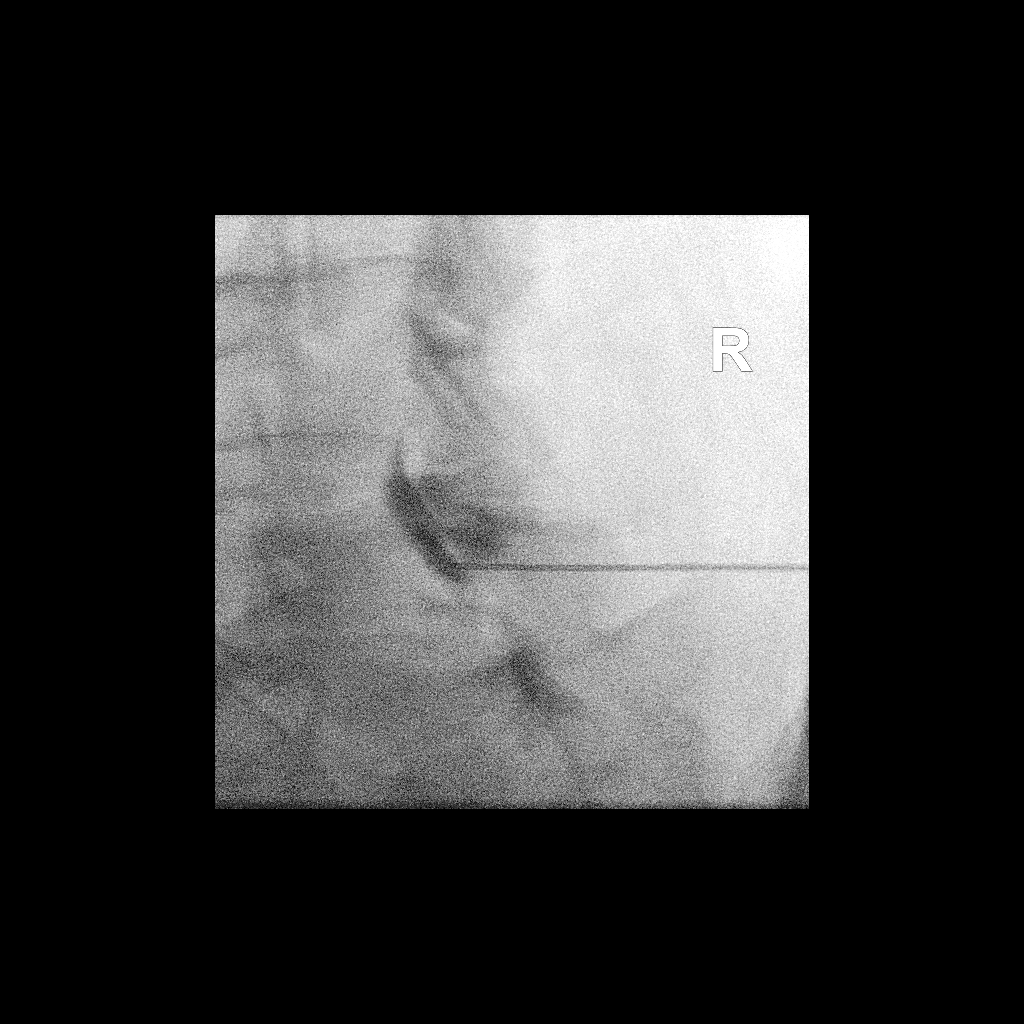

[Series 2: ortho standard · 1 of 1 slices shown (2 of 2)]
[im 1/1]
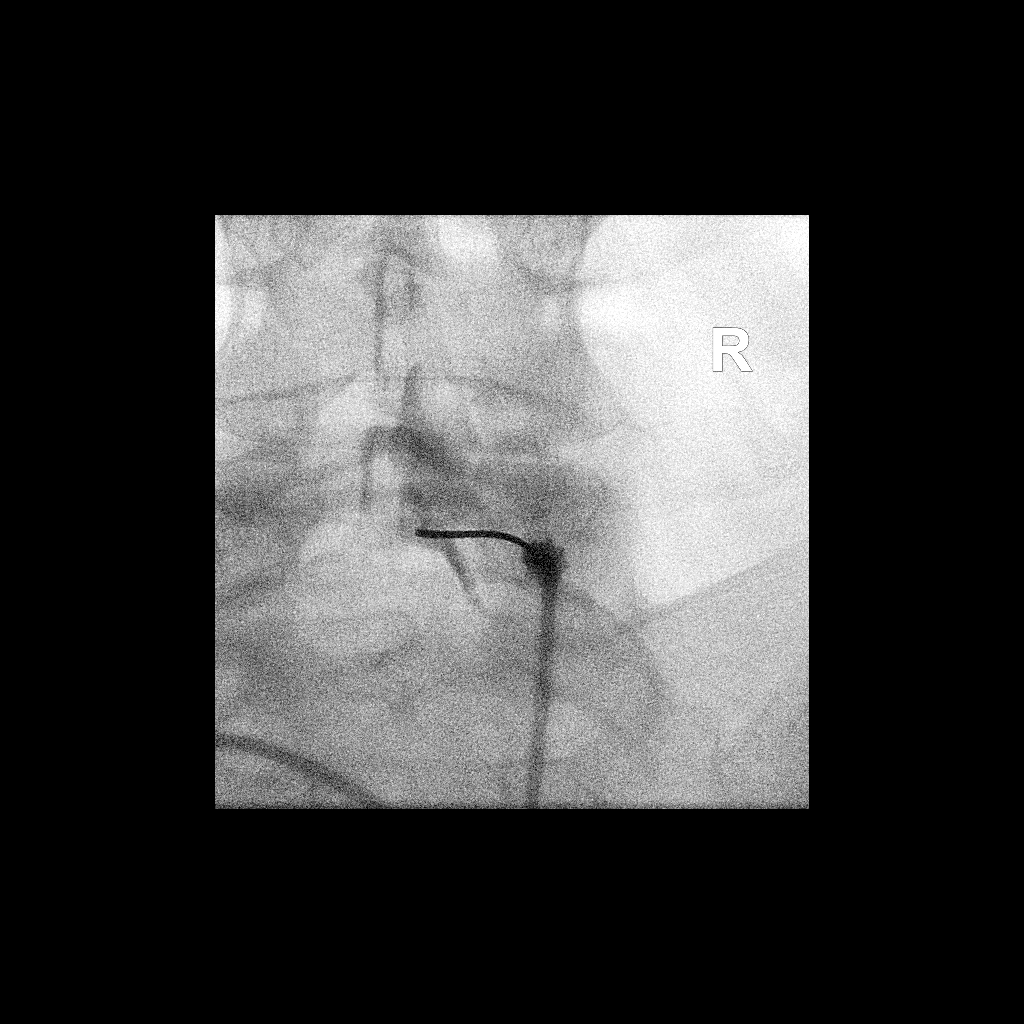

[2 of 2 positions shown; findings below may reference images not displayed]

FLUOROSCOPY:
Radiation Exposure Index (as provided by the fluoroscopic device):
2.7 mGy Kerma

PROCEDURE:
The procedure, risks, benefits, and alternatives were explained to
the patient. Questions regarding the procedure were encouraged and
answered. The patient understands and consents to the procedure.

LUMBAR EPIDURAL INJECTION:

An interlaminar approach was performed on right at L5-S1. The
overlying skin was cleansed and anesthetized. A 20 gauge epidural
needle was advanced using loss-of-resistance technique.

DIAGNOSTIC EPIDURAL INJECTION:

Injection of Isovue-M 200 shows a good epidural pattern with spread
above and below the level of needle placement, primarily on the
right no vascular opacification is seen.

THERAPEUTIC EPIDURAL INJECTION:

80 mg of Depo-Medrol mixed with 2 mL 1% lidocaine were instilled.
The procedure was well-tolerated, and the patient was discharged
thirty minutes following the injection in good condition.

COMPLICATIONS:
None.
IMPRESSION: Technically successful epidural injection on the right L5-S1.
